# Patient Record
Sex: Male | Born: 1960 | Race: Black or African American | Hispanic: No | Marital: Single | Smoking: Current every day smoker
Health system: Southern US, Community
[De-identification: ages and names within clinical notes are randomized; demographics above are authoritative.]

## PROBLEM LIST (undated history)

## (undated) DIAGNOSIS — F1010 Alcohol abuse, uncomplicated: Secondary | ICD-10-CM

## (undated) HISTORY — PX: FACIAL LACERATION REPAIR: SHX6589

---

## 1998-05-07 ENCOUNTER — Emergency Department (HOSPITAL_COMMUNITY): Admission: EM | Admit: 1998-05-07 | Discharge: 1998-05-07 | Payer: Self-pay | Admitting: Emergency Medicine

## 1998-05-19 ENCOUNTER — Emergency Department (HOSPITAL_COMMUNITY): Admission: EM | Admit: 1998-05-19 | Discharge: 1998-05-19 | Payer: Self-pay | Admitting: Emergency Medicine

## 1998-05-27 ENCOUNTER — Emergency Department (HOSPITAL_COMMUNITY): Admission: EM | Admit: 1998-05-27 | Discharge: 1998-05-27 | Payer: Self-pay | Admitting: Emergency Medicine

## 1999-05-07 ENCOUNTER — Encounter: Payer: Self-pay | Admitting: *Deleted

## 1999-05-07 ENCOUNTER — Emergency Department (HOSPITAL_COMMUNITY): Admission: EM | Admit: 1999-05-07 | Discharge: 1999-05-07 | Payer: Self-pay | Admitting: *Deleted

## 1999-05-12 ENCOUNTER — Emergency Department (HOSPITAL_COMMUNITY): Admission: EM | Admit: 1999-05-12 | Discharge: 1999-05-12 | Payer: Self-pay | Admitting: Emergency Medicine

## 1999-05-21 ENCOUNTER — Emergency Department (HOSPITAL_COMMUNITY): Admission: EM | Admit: 1999-05-21 | Discharge: 1999-05-21 | Payer: Self-pay | Admitting: Emergency Medicine

## 1999-05-22 ENCOUNTER — Encounter: Payer: Self-pay | Admitting: Emergency Medicine

## 1999-07-16 ENCOUNTER — Emergency Department (HOSPITAL_COMMUNITY): Admission: EM | Admit: 1999-07-16 | Discharge: 1999-07-16 | Payer: Self-pay | Admitting: Emergency Medicine

## 1999-12-19 ENCOUNTER — Emergency Department (HOSPITAL_COMMUNITY): Admission: EM | Admit: 1999-12-19 | Discharge: 1999-12-19 | Payer: Self-pay | Admitting: Emergency Medicine

## 1999-12-19 ENCOUNTER — Encounter: Payer: Self-pay | Admitting: Emergency Medicine

## 2000-01-02 ENCOUNTER — Emergency Department (HOSPITAL_COMMUNITY): Admission: EM | Admit: 2000-01-02 | Discharge: 2000-01-02 | Payer: Self-pay | Admitting: Emergency Medicine

## 2000-01-02 ENCOUNTER — Encounter: Payer: Self-pay | Admitting: Emergency Medicine

## 2000-01-21 ENCOUNTER — Inpatient Hospital Stay (HOSPITAL_COMMUNITY): Admission: EM | Admit: 2000-01-21 | Discharge: 2000-01-21 | Payer: Self-pay

## 2000-01-30 ENCOUNTER — Emergency Department (HOSPITAL_COMMUNITY): Admission: EM | Admit: 2000-01-30 | Discharge: 2000-01-30 | Payer: Self-pay | Admitting: Emergency Medicine

## 2000-01-31 ENCOUNTER — Inpatient Hospital Stay (HOSPITAL_COMMUNITY): Admission: AD | Admit: 2000-01-31 | Discharge: 2000-02-03 | Payer: Self-pay | Admitting: Otolaryngology

## 2000-03-24 ENCOUNTER — Emergency Department (HOSPITAL_COMMUNITY): Admission: EM | Admit: 2000-03-24 | Discharge: 2000-03-24 | Payer: Self-pay | Admitting: Emergency Medicine

## 2000-03-24 ENCOUNTER — Encounter: Payer: Self-pay | Admitting: Emergency Medicine

## 2000-06-30 ENCOUNTER — Encounter: Payer: Self-pay | Admitting: Emergency Medicine

## 2000-06-30 ENCOUNTER — Emergency Department (HOSPITAL_COMMUNITY): Admission: EM | Admit: 2000-06-30 | Discharge: 2000-06-30 | Payer: Self-pay | Admitting: Emergency Medicine

## 2000-07-09 ENCOUNTER — Emergency Department (HOSPITAL_COMMUNITY): Admission: EM | Admit: 2000-07-09 | Discharge: 2000-07-09 | Payer: Self-pay | Admitting: Emergency Medicine

## 2000-08-03 ENCOUNTER — Encounter: Payer: Self-pay | Admitting: *Deleted

## 2000-08-03 ENCOUNTER — Emergency Department (HOSPITAL_COMMUNITY): Admission: EM | Admit: 2000-08-03 | Discharge: 2000-08-03 | Payer: Self-pay | Admitting: Emergency Medicine

## 2000-09-17 ENCOUNTER — Emergency Department (HOSPITAL_COMMUNITY): Admission: EM | Admit: 2000-09-17 | Discharge: 2000-09-17 | Payer: Self-pay | Admitting: Emergency Medicine

## 2000-10-31 ENCOUNTER — Emergency Department (HOSPITAL_COMMUNITY): Admission: EM | Admit: 2000-10-31 | Discharge: 2000-10-31 | Payer: Self-pay | Admitting: Emergency Medicine

## 2001-05-17 ENCOUNTER — Emergency Department (HOSPITAL_COMMUNITY): Admission: EM | Admit: 2001-05-17 | Discharge: 2001-05-17 | Payer: Self-pay | Admitting: Emergency Medicine

## 2001-05-17 ENCOUNTER — Encounter: Payer: Self-pay | Admitting: Emergency Medicine

## 2001-07-27 ENCOUNTER — Emergency Department (HOSPITAL_COMMUNITY): Admission: EM | Admit: 2001-07-27 | Discharge: 2001-07-27 | Payer: Self-pay | Admitting: Emergency Medicine

## 2001-08-06 ENCOUNTER — Emergency Department (HOSPITAL_COMMUNITY): Admission: EM | Admit: 2001-08-06 | Discharge: 2001-08-06 | Payer: Self-pay

## 2001-11-03 ENCOUNTER — Emergency Department (HOSPITAL_COMMUNITY): Admission: EM | Admit: 2001-11-03 | Discharge: 2001-11-03 | Payer: Self-pay | Admitting: Emergency Medicine

## 2001-11-03 ENCOUNTER — Encounter: Payer: Self-pay | Admitting: Emergency Medicine

## 2002-01-24 ENCOUNTER — Emergency Department (HOSPITAL_COMMUNITY): Admission: EM | Admit: 2002-01-24 | Discharge: 2002-01-24 | Payer: Self-pay | Admitting: Emergency Medicine

## 2002-02-07 ENCOUNTER — Emergency Department (HOSPITAL_COMMUNITY): Admission: EM | Admit: 2002-02-07 | Discharge: 2002-02-07 | Payer: Self-pay | Admitting: Emergency Medicine

## 2002-03-28 ENCOUNTER — Encounter: Payer: Self-pay | Admitting: Emergency Medicine

## 2002-03-28 ENCOUNTER — Emergency Department (HOSPITAL_COMMUNITY): Admission: EM | Admit: 2002-03-28 | Discharge: 2002-03-29 | Payer: Self-pay | Admitting: Emergency Medicine

## 2003-06-27 ENCOUNTER — Encounter: Payer: Self-pay | Admitting: Family Medicine

## 2003-06-27 ENCOUNTER — Ambulatory Visit (HOSPITAL_COMMUNITY): Admission: RE | Admit: 2003-06-27 | Discharge: 2003-06-27 | Payer: Self-pay | Admitting: Family Medicine

## 2003-07-29 ENCOUNTER — Encounter: Payer: Self-pay | Admitting: Emergency Medicine

## 2003-07-29 ENCOUNTER — Emergency Department (HOSPITAL_COMMUNITY): Admission: EM | Admit: 2003-07-29 | Discharge: 2003-07-29 | Payer: Self-pay | Admitting: Emergency Medicine

## 2004-09-13 ENCOUNTER — Ambulatory Visit: Payer: Self-pay | Admitting: Nurse Practitioner

## 2004-11-04 ENCOUNTER — Emergency Department (HOSPITAL_COMMUNITY): Admission: EM | Admit: 2004-11-04 | Discharge: 2004-11-04 | Payer: Self-pay | Admitting: Emergency Medicine

## 2005-06-14 ENCOUNTER — Emergency Department (HOSPITAL_COMMUNITY): Admission: EM | Admit: 2005-06-14 | Discharge: 2005-06-14 | Payer: Self-pay | Admitting: Emergency Medicine

## 2005-06-30 ENCOUNTER — Emergency Department (HOSPITAL_COMMUNITY): Admission: EM | Admit: 2005-06-30 | Discharge: 2005-06-30 | Payer: Self-pay | Admitting: Family Medicine

## 2005-08-18 ENCOUNTER — Encounter: Admission: RE | Admit: 2005-08-18 | Discharge: 2005-09-07 | Payer: Self-pay | Admitting: Family Medicine

## 2006-03-29 ENCOUNTER — Emergency Department (HOSPITAL_COMMUNITY): Admission: EM | Admit: 2006-03-29 | Discharge: 2006-03-29 | Payer: Self-pay | Admitting: Emergency Medicine

## 2008-03-15 ENCOUNTER — Emergency Department (HOSPITAL_COMMUNITY): Admission: EM | Admit: 2008-03-15 | Discharge: 2008-03-16 | Payer: Self-pay | Admitting: Family Medicine

## 2010-07-05 ENCOUNTER — Emergency Department (HOSPITAL_COMMUNITY): Admission: EM | Admit: 2010-07-05 | Discharge: 2010-07-05 | Payer: Self-pay | Admitting: Emergency Medicine

## 2010-11-25 ENCOUNTER — Ambulatory Visit
Admission: RE | Admit: 2010-11-25 | Discharge: 2010-11-25 | Payer: Self-pay | Source: Home / Self Care | Attending: Otolaryngology | Admitting: Otolaryngology

## 2010-12-03 ENCOUNTER — Emergency Department (HOSPITAL_COMMUNITY)
Admission: EM | Admit: 2010-12-03 | Discharge: 2010-12-03 | Payer: Self-pay | Source: Home / Self Care | Admitting: Emergency Medicine
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
## 2011-09-11 LAB — RAPID URINE DRUG SCREEN, HOSP PERFORMED
Amphetamines: NOT DETECTED
Barbiturates: NOT DETECTED
Benzodiazepines: NOT DETECTED
Tetrahydrocannabinol: NOT DETECTED

## 2011-09-11 LAB — POCT I-STAT, CHEM 8
Potassium: 4
Sodium: 133 — ABNORMAL LOW

## 2011-09-11 LAB — POCT CARDIAC MARKERS
CKMB, poc: <1 — ABNORMAL LOW
Myoglobin, poc: 19.5

## 2012-05-10 ENCOUNTER — Encounter (HOSPITAL_COMMUNITY): Payer: Self-pay | Admitting: Physical Medicine and Rehabilitation

## 2012-05-10 ENCOUNTER — Inpatient Hospital Stay (HOSPITAL_COMMUNITY): Admission: RE | Admit: 2012-05-10 | Payer: Medicare Other | Source: Ambulatory Visit | Admitting: Psychiatry

## 2012-05-10 ENCOUNTER — Emergency Department (HOSPITAL_COMMUNITY)
Admission: EM | Admit: 2012-05-10 | Discharge: 2012-05-10 | Disposition: A | Discharge status: Home / Self Care | Payer: PRIVATE HEALTH INSURANCE | Attending: Emergency Medicine | Admitting: Emergency Medicine

## 2012-05-10 DIAGNOSIS — F17200 Nicotine dependence, unspecified, uncomplicated: Secondary | ICD-10-CM | POA: Insufficient documentation

## 2012-05-10 DIAGNOSIS — F1010 Alcohol abuse, uncomplicated: Secondary | ICD-10-CM | POA: Insufficient documentation

## 2012-05-10 LAB — CBC
Hemoglobin: 14.5 g/dL (ref 13.0–17.0)
MCHC: 36.3 g/dL — ABNORMAL HIGH (ref 30.0–36.0)
Platelets: 238 10*3/uL (ref 150–400)
RBC: 4.42 MIL/uL (ref 4.22–5.81)
RDW: 13.8 % (ref 11.5–15.5)

## 2012-05-10 LAB — COMPREHENSIVE METABOLIC PANEL
ALT: 32 U/L (ref 0–53)
Albumin: 3.7 g/dL (ref 3.5–5.2)
Alkaline Phosphatase: 106 U/L (ref 39–117)
BUN: 10 mg/dL (ref 6–23)
Chloride: 102 mEq/L (ref 96–112)
GFR calc Af Amer: >90 mL/min (ref >90)
GFR calc non Af Amer: >90 mL/min (ref >90)
Potassium: 3.5 mEq/L (ref 3.5–5.1)
Sodium: 139 mEq/L (ref 135–145)
Total Bilirubin: 0.2 mg/dL — ABNORMAL LOW (ref 0.3–1.2)
Total Protein: 7.6 g/dL (ref 6.0–8.3)

## 2012-05-10 LAB — RAPID URINE DRUG SCREEN, HOSP PERFORMED
Amphetamines: NOT DETECTED
Barbiturates: NOT DETECTED
Opiates: NOT DETECTED

## 2012-05-10 MED ORDER — ZOLPIDEM TARTRATE 5 MG PO TABS: 5.0000 mg | ORAL_TABLET | Freq: Every evening | ORAL | Status: DC | PRN

## 2012-05-10 MED ORDER — NICOTINE 21 MG/24HR TD PT24
21.0000 mg | MEDICATED_PATCH | Freq: Every day | TRANSDERMAL | Status: DC
  Administered 2012-05-10: 21 mg via TRANSDERMAL
  Filled 2012-05-10: qty 1

## 2012-05-10 MED ORDER — ACETAMINOPHEN 325 MG PO TABS
650.0000 mg | ORAL_TABLET | ORAL | Status: DC | PRN
  Administered 2012-05-10: 650 mg via ORAL
  Filled 2012-05-10: qty 2

## 2012-05-10 MED ORDER — ONDANSETRON HCL 8 MG PO TABS: 4.0000 mg | ORAL_TABLET | Freq: Three times a day (TID) | ORAL | Status: DC | PRN

## 2012-05-10 MED ORDER — IBUPROFEN 200 MG PO TABS: 600.0000 mg | ORAL_TABLET | Freq: Three times a day (TID) | ORAL | Status: DC | PRN
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
## 2012-05-10 NOTE — ED Notes (Signed)
Patient refuses treatment to [facility]. ACT to inform EDP for d/c home

## 2012-05-10 NOTE — ED Notes (Signed)
Pt presents to department for evaluation of detox from ETOH. States "I want to get help." drinks x5 beers each day, last drink this afternoon. Denies pain at the time. He is conscious alert and oriented x4. Denies SI/HI. No signs of distress at present.

## 2012-05-10 NOTE — ED Notes (Signed)
MD, Linker at bedside. 

## 2012-05-10 NOTE — Discharge Instructions (Signed)
Return to the ED with any concerns including seizures, vomiting and not able to keep down liquids, decreaesed level of alertness/lethargy, or any other alarming symptoms

## 2012-05-10 NOTE — ED Notes (Signed)
Patient has been accepted to [facility]. ACT at bedside speaking with patient re: plan of care

## 2012-05-10 NOTE — ED Provider Notes (Signed)
History     CSN: [id number]  Arrival date & time 05/10/12  1458   First MD Initiated Contact with Patient 05/10/12 1527      Chief Complaint  Patient presents with  . Alcohol Problem    (Consider location/radiation/quality/duration/timing/severity/associated sxs/prior treatment) HPI Pt presents with c/o requesting alcohol detox/rehab.  Pt states that he drinks daily- has been trying to cut down on his own.  He also states he is in a program after getting a DUI and he was recommended to come to try to get help for his alcohol use priorto gloing to court.  Pt denies other substance use.  Has not had seizures or vomiting or any other systemic symtpoms.  No recent illnesses.  He denies feeling homicidal or sucidal.  There are no other alleviating or modifying factors.  There are no other associated systemic symptoms.   No past medical history on file.  No past surgical history on file.  History reviewed. No pertinent family history.  History  Substance Use Topics  . Smoking status: Current Everyday Smoker  . Smokeless tobacco: Not on file  . Alcohol Use: 43.2 oz/week    72 Cans of beer per week      Review of Systems ROS reviewed and all otherwise negative except for mentioned in HPI  Allergies  Review of patient's allergies indicates no known allergies.  Home Medications  No current outpatient prescriptions on file.  BP 169/107  Pulse 85  Temp(Src) 98.6 F (37 C) (Oral)  Resp 19  SpO2 98% Vitals reviewed Physical Exam Physical Examination: General appearance - alert, well appearing, and in no distress Mental status - alert, oriented to person, place, and time Eyes - pupils equal and reactive, extraocular eye movements intact Mouth - mucous membranes moist, pharynx normal without lesions Chest - clear to auscultation, no wheezes, rales or rhonchi, symmetric air entry Heart - normal rate, regular rhythm, normal S1, S2, no murmurs, rubs, clicks or gallops Abdomen -  soft, nontender, nondistended, no masses or organomegaly Neurological - alert, oriented, normal speech, no focal findings or movement disorder noted Extremities - peripheral pulses normal, no pedal edema, no clubbing or cyanosis Skin - normal coloration and turgor, no rashes Pscyh- normal mood and affect, calm and cooperative  ED Course  Procedures (including critical care time)  4:50 PM  Discussed with ACT team, they will see and evaluate patient in the ED.   10:23 PM ACT has seen patient in the ED. He was referred and accepted to Behavioral health and now does not want to go.  Pt is here voluntarily, and no active signs of withdrawal will discharge.   Labs Reviewed  COMPREHENSIVE METABOLIC PANEL - Abnormal; Notable for the following:    AST 46 (*)    Total Bilirubin 0.2 (*)    All other components within normal limits  ETHANOL - Abnormal; Notable for the following:    Alcohol, Ethyl (B) 170 (*)    All other components within normal limits  CBC - Abnormal; Notable for the following:    MCHC 36.3 (*)    All other components within normal limits  URINE RAPID DRUG SCREEN (HOSP PERFORMED) - Abnormal; Notable for the following:    Cocaine POSITIVE (*)    All other components within normal limits  LAB REPORT - SCANNED   No results found.   1. Alcohol abuse       MDM  Pt presenting requesting alcohol detox.  He was medically cleared, exhibiting  no signs of withdrawal.  He was evaluted by ACT team who found placement for him at [facility]- however, he then declined to proceed with treatment. Therefore he was discharged- given strict return precautions.  He is agreeable with this plan.         Ethelda Chick, MD 05/11/12 [phone]

## 2012-05-10 NOTE — ED Notes (Signed)
ACT team at bedside.  

## 2012-05-16 ENCOUNTER — Inpatient Hospital Stay (HOSPITAL_COMMUNITY): Admit: 2012-05-16 | Payer: Self-pay

## 2012-05-27 ENCOUNTER — Emergency Department (HOSPITAL_COMMUNITY)
Admission: EM | Admit: 2012-05-27 | Discharge: 2012-05-27 | Disposition: A | Discharge status: Home / Self Care | Payer: Medicare Other | Attending: Emergency Medicine | Admitting: Emergency Medicine

## 2012-05-27 ENCOUNTER — Encounter (HOSPITAL_COMMUNITY): Payer: Self-pay

## 2012-05-27 DIAGNOSIS — F1010 Alcohol abuse, uncomplicated: Secondary | ICD-10-CM | POA: Insufficient documentation

## 2012-05-27 DIAGNOSIS — F17200 Nicotine dependence, unspecified, uncomplicated: Secondary | ICD-10-CM | POA: Insufficient documentation

## 2012-05-27 LAB — BASIC METABOLIC PANEL WITH GFR
BUN: 7 mg/dL (ref 6–23)
CO2: 22 meq/L (ref 19–32)
Calcium: 8.9 mg/dL (ref 8.4–10.5)
Chloride: 99 meq/L (ref 96–112)
Creatinine, Ser: 0.74 mg/dL (ref 0.50–1.35)
GFR calc Af Amer: >90 mL/min
GFR calc non Af Amer: >90 mL/min
Glucose, Bld: 94 mg/dL (ref 70–99)
Potassium: 3.6 meq/L (ref 3.5–5.1)
Sodium: 137 meq/L (ref 135–145)

## 2012-05-27 LAB — CBC
Hemoglobin: 13.7 g/dL (ref 13.0–17.0)
MCH: 32.1 pg (ref 26.0–34.0)
MCHC: 35.9 g/dL (ref 30.0–36.0)
Platelets: 239 10*3/uL (ref 150–400)
RBC: 4.27 MIL/uL (ref 4.22–5.81)

## 2012-05-27 LAB — RAPID URINE DRUG SCREEN, HOSP PERFORMED
Benzodiazepines: NOT DETECTED
Cocaine: NOT DETECTED
Opiates: NOT DETECTED

## 2012-05-27 LAB — ETHANOL: Alcohol, Ethyl (B): 280 mg/dL — ABNORMAL HIGH (ref 0–11)

## 2012-05-27 MED ORDER — NICOTINE 21 MG/24HR TD PT24
21.0000 mg | MEDICATED_PATCH | Freq: Every day | TRANSDERMAL | Status: DC
  Administered 2012-05-27: 21 mg via TRANSDERMAL
  Filled 2012-05-27: qty 1

## 2012-05-27 MED ORDER — ALUM & MAG HYDROXIDE-SIMETH 200-200-20 MG/5ML PO SUSP: 30.0000 mL | ORAL | Status: DC | PRN

## 2012-05-27 MED ORDER — ZOLPIDEM TARTRATE 5 MG PO TABS: 5.0000 mg | ORAL_TABLET | Freq: Every evening | ORAL | Status: DC | PRN

## 2012-05-27 MED ORDER — LORAZEPAM 1 MG PO TABS
0.0000 mg | ORAL_TABLET | Freq: Four times a day (QID) | ORAL | Status: DC
  Administered 2012-05-27: 1 mg via ORAL
  Filled 2012-05-27: qty 1

## 2012-05-27 MED ORDER — THIAMINE HCL 100 MG/ML IJ SOLN: 100.0000 mg | Freq: Every day | INTRAMUSCULAR | Status: DC

## 2012-05-27 MED ORDER — VITAMIN B-1 100 MG PO TABS
100.0000 mg | ORAL_TABLET | Freq: Every day | ORAL | Status: DC
  Administered 2012-05-27: 100 mg via ORAL
  Filled 2012-05-27: qty 1

## 2012-05-27 MED ORDER — ONDANSETRON HCL 8 MG PO TABS: 4.0000 mg | ORAL_TABLET | Freq: Three times a day (TID) | ORAL | Status: DC | PRN

## 2012-05-27 MED ORDER — ACETAMINOPHEN 325 MG PO TABS: 650.0000 mg | ORAL_TABLET | ORAL | Status: DC | PRN

## 2012-05-27 MED ORDER — ADULT MULTIVITAMIN W/MINERALS CH
1.0000 | ORAL_TABLET | Freq: Every day | ORAL | Status: DC
  Administered 2012-05-27: 1 via ORAL
  Filled 2012-05-27 (×2): qty 1

## 2012-05-27 MED ORDER — LORAZEPAM 1 MG PO TABS: 0.0000 mg | ORAL_TABLET | Freq: Two times a day (BID) | ORAL | Status: DC

## 2012-05-27 MED ORDER — IBUPROFEN 200 MG PO TABS: 600.0000 mg | ORAL_TABLET | Freq: Three times a day (TID) | ORAL | Status: DC | PRN

## 2012-05-27 MED ORDER — LORAZEPAM 1 MG PO TABS: 1.0000 mg | ORAL_TABLET | Freq: Four times a day (QID) | ORAL | Status: DC | PRN

## 2012-05-27 MED ORDER — FOLIC ACID 1 MG PO TABS
1.0000 mg | ORAL_TABLET | Freq: Every day | ORAL | Status: DC
  Administered 2012-05-27: 1 mg via ORAL
  Filled 2012-05-27: qty 1

## 2012-05-27 MED ORDER — LORAZEPAM 2 MG/ML IJ SOLN: 1.0000 mg | Freq: Four times a day (QID) | INTRAMUSCULAR | Status: DC | PRN

## 2012-05-27 NOTE — ED Provider Notes (Signed)
History     CSN: [id number]  Arrival date & time 05/27/12  1826   First MD Initiated Contact with Patient 05/27/12 1850      Chief Complaint  Patient presents with  . Alcohol Problem    Detox    (Consider location/radiation/quality/duration/timing/severity/associated sxs/prior treatment) Patient is a 51 y.o. male presenting with alcohol problem. The history is provided by the patient.  Alcohol Problem This is a chronic problem.  Requests assistance with detox. Typically drinks at least 2 40oz beers each day, last intake this afternoon. Has never had withdrawal seizures. Not feeling anxious or tremulous at this time. Denies any drug use. Denies SI/HI or hallucinations. Per records, seen in ED for same several weeks ago but left prior to placement. Denies physical complaints aside from chronic, unchanged right hip pain.  History reviewed. No pertinent past medical history.  History reviewed. No pertinent past surgical history.  No family history on file.  History  Substance Use Topics  . Smoking status: Current Everyday Smoker  . Smokeless tobacco: Not on file  . Alcohol Use: 43.2 oz/week    72 Cans of beer per week      Review of Systems 10 systems reviewed and are negative for acute change except as noted in the HPI.  Allergies  Review of patient's allergies indicates no known allergies.  Home Medications   Current Outpatient Rx  Name Route Sig Dispense Refill  . ACETAMINOPHEN 325 MG PO TABS Oral Take 650 mg by mouth every 6 (six) hours as needed. For hip pain      BP 143/97  Pulse 90  Temp(Src) 98.5 F (36.9 C) (Oral)  Resp 18  SpO2 95%  Physical Exam  Nursing note reviewed. Constitutional: He appears well-developed and well-nourished. No distress.       BP 143/97  Pulse 90  Temp(Src) 98.5 F (36.9 C) (Oral)  Resp 18  SpO2 95% VS reviewed, sig for HTN. No tachycardia.  HENT:  Head: Normocephalic and atraumatic.  Right Ear: External ear normal.    Left Ear: External ear normal.       MMM  Eyes: Pupils are equal, round, and reactive to light.  Neck: Neck supple.  Cardiovascular: Normal rate, regular rhythm and normal heart sounds.        Bilateral radial and DP pulses are 2+   Pulmonary/Chest: Effort normal and breath sounds normal. No respiratory distress. He has no wheezes.  Abdominal: Soft. Bowel sounds are normal. He exhibits no distension. There is no tenderness. There is no guarding.  Neurological: He is alert. He has normal strength. He displays no tremor. No cranial nerve deficit (3-12 intact). He displays no seizure activity. Gait (antalgic) abnormal. GCS eye subscore is 4. GCS verbal subscore is 5. GCS motor subscore is 6.  Skin: Skin is warm and dry.  Psychiatric: He has a normal mood and affect. His speech is normal and behavior is normal. Thought content is not paranoid. He expresses no homicidal and no suicidal ideation.    ED Course  Procedures (including critical care time)   Labs Reviewed  ETHANOL  CBC  BASIC METABOLIC PANEL  URINE RAPID DRUG SCREEN (HOSP PERFORMED)   No results found.   Dx 1: Alcohol abuse   MDM  Pt requesting assistance with detox. All labs reviewed. Alcohol level suggests intoxication. After several hours in ED, pt is clinically sober with clear speech, antalgic but otherwise normal gait, no tremors, no tachycardia. He has a court date in  less than 48 hours and cannot be placed at any facility for detox until after this. He voices understanding and is given resources at d/c for follow-up after court.        Shaaron Adler, [place] 05/27/12 2206

## 2012-05-27 NOTE — ED Notes (Signed)
Pt reports having last drink (2 beers) this morning around 0900.

## 2012-05-27 NOTE — Discharge Instructions (Signed)
Alcohol Withdrawal   Anytime drug use is interfering with normal living activities it has become abuse. This includes problems with family and friends. Psychological dependence has developed when your mind tells you that the drug is needed. This is usually followed by physical dependence when a continuing increase of drugs are required to get the same feeling or "high." This is known as addiction or chemical dependency. A person's risk is much higher if there is a history of chemical dependency in the family.   Mild Withdrawal Following Stopping Alcohol, When Addiction or Chemical Dependency Has Developed   When a person has developed tolerance to alcohol, any sudden stopping of alcohol can cause uncomfortable physical symptoms. Most of the time these are mild and consist of tremors in the hands and increases in heart rate, breathing, and temperature. Sometimes these symptoms are associated with anxiety, panic attacks, and bad dreams. There may also be stomach upset. Normal sleep patterns are often interrupted with periods of inability to sleep (insomnia). This may last for 6 months. Because of this discomfort, many people choose to continue drinking to get rid of this discomfort and to try to feel normal.   Severe Withdrawal with Decreased or No Alcohol Intake, When Addiction or Chemical Dependency Has Developed   About five percent of alcoholics will develop signs of severe withdrawal when they stop using alcohol. One sign of this is development of generalized seizures (convulsions). Other signs of this are severe agitation and confusion. This may be associated with believing in things which are not real or seeing things which are not really there (delusions and hallucinations). Vitamin deficiencies are usually present if alcohol intake has been long-term. Treatment for this most often requires hospitalization and close observation.   Addiction can only be helped by stopping use of all chemicals. This is hard but  may save your life. With continual alcohol use, possible outcomes are usually loss of self respect and esteem, violence, and death.   Addiction cannot be cured but it can be stopped. This often requires outside help and the care of professionals. Treatment centers are listed in the yellow pages under Cocaine, Narcotics, and Alcoholics Anonymous. Most hospitals and clinics can refer you to a specialized care center.   It is not necessary for you to go through the uncomfortable symptoms of withdrawal. Your caregiver can provide you with medicines that will help you through this difficult period. Try to avoid situations, friends, or drugs that made it possible for you to keep using alcohol in the past. Learn how to say no.   It takes a long period of time to overcome addictions to all drugs, including alcohol. There may be many times when you feel as though you want a drink. After getting rid of the physical addiction and withdrawal, you will have a lessening of the craving which tells you that you need alcohol to feel normal. Call your caregiver if more support is needed. Learn who to talk to in your family and among your friends so that during these periods you can receive outside help. Alcoholics Anonymous (AA) has helped many people over the years. To get further help, contact AA or call your caregiver, counselor, or clergyperson. Al-Anon and Alateen are support groups for friends and family members of an alcoholic. The people who love and care for an alcoholic often need help, too. For information about these organizations, check your phone directory or call a local alcoholism treatment center.   SEEK IMMEDIATE MEDICAL CARE   IF:   You have a seizure.   You have a fever.   You experience uncontrolled vomiting or you vomit up blood. This may be bright red or look like black coffee grounds.   You have blood in the stool. This may be bright red or appear as a black, tarry, bad-smelling stool.   You become lightheaded  or faint. Do not drive if you feel this way. Have someone else drive you or call 911 for help.   You become more agitated or confused.   You develop uncontrolled anxiety.   You begin to see things that are not really there (hallucinate).  Your caregiver has determined that you completely understand your medical condition, and that your mental state is back to normal. You understand that you have been treated for alcohol withdrawal, have agreed not to drink any alcohol for a minimum of 1 day, will not operate a car or other machinery for 24 hours, and have had an opportunity to ask any questions about your condition.   Document Released: 09/13/2005 Document Revised: 11/23/2011 Document Reviewed: 07/22/2008   [employer] Patient Information 2012 [employer].

## 2012-05-27 NOTE — ED Notes (Signed)
Patient presents requesting detox from alcohol, last drink was this afternoon and patient reports he had 1 beer.  Patient has been in rehabilitation nearly 1 year ago.  No tremors or anxiety noted.

## 2012-05-29 NOTE — ED Provider Notes (Signed)
Medical screening examination/treatment/procedure(s) were performed by non-physician practitioner and as supervising physician I was immediately available for consultation/collaboration.   Joya Gaskins, MD 05/29/12 [phone]

## 2017-03-30 ENCOUNTER — Encounter (HOSPITAL_COMMUNITY): Payer: Self-pay | Admitting: Obstetrics and Gynecology

## 2017-03-30 ENCOUNTER — Emergency Department (HOSPITAL_COMMUNITY): Payer: Medicare Other

## 2017-03-30 ENCOUNTER — Emergency Department (HOSPITAL_COMMUNITY)
Admission: EM | Admit: 2017-03-30 | Discharge: 2017-03-31 | Disposition: A | Discharge status: Home / Self Care | Payer: Medicare Other | Attending: Emergency Medicine | Admitting: Emergency Medicine

## 2017-03-30 DIAGNOSIS — R066 Hiccough: Secondary | ICD-10-CM | POA: Diagnosis not present

## 2017-03-30 DIAGNOSIS — F17200 Nicotine dependence, unspecified, uncomplicated: Secondary | ICD-10-CM | POA: Diagnosis not present

## 2017-03-30 DIAGNOSIS — F1010 Alcohol abuse, uncomplicated: Secondary | ICD-10-CM | POA: Diagnosis not present

## 2017-03-30 DIAGNOSIS — R10816 Epigastric abdominal tenderness: Secondary | ICD-10-CM | POA: Diagnosis not present

## 2017-03-30 NOTE — ED Provider Notes (Signed)
[facility]-EMERGENCY DEPT Provider Note   CSN: [id number] Arrival date & time: 03/30/17  2021    By signing my name below, I, Clayton Alvarez, attest that this documentation has been prepared under the direction and in the presence of Benjiman Core, MD. Electronically Signed: Valentino Alvarez, ED Scribe. 03/30/17. 12:59 AM.  History   Chief Complaint Chief Complaint  Patient presents with  . Hiccups   The history is provided by the patient. No language interpreter was used.   HPI Comments: Clayton Alvarez is a 56 y.o. male with no pertinent PMHx brought in by ambulance who presents to the Emergency Department complaining of 6/10, moderate, intemittent, abdominal pain secondary to episodic hiccups onset a month ago. Pt describes his pain as aching, non-radiating sensation. He reports associated chills. No alleviating factors noted. Pt denies taking any OTC medications for relief. Pt also complains of foot pain. Pt notes his abdominal pain is worsened with direct pressure, but states it has subsided in the ED. Pt denies CP, SOB, nausea, vomiting, weight loss. Pt states his is a current smoker. He admits to illicit drug use.    History reviewed. No pertinent past medical history.  There are no active problems to display for this patient.   History reviewed. No pertinent surgical history.     Home Medications    Prior to Admission medications   Medication Sig Start Date End Date Taking? Authorizing Provider  acetaminophen (TYLENOL) 325 MG tablet Take 650 mg by mouth every 6 (six) hours as needed. For hip pain    Historical Provider, MD    Family History No family history on file.  Social History Social History  Substance Use Topics  . Smoking status: Current Every Day Smoker  . Smokeless tobacco: Not on file  . Alcohol use 43.2 oz/week    72 Cans of beer per week     Allergies   Patient has no known allergies.   Review of Systems Review of Systems  Constitutional:  Positive for chills. Negative for unexpected weight change.  Respiratory: Negative for shortness of breath.   Cardiovascular: Negative for chest pain.  Gastrointestinal: Positive for abdominal pain. Negative for nausea and vomiting.  All other systems reviewed and are negative.    Physical Exam Updated Vital Signs BP (!) 146/95 (BP Location: Left Arm)   Pulse 96   Temp 98.4 F (36.9 C) (Oral)   Resp 14   Ht 6' (1.829 m)   Wt 160 lb (72.6 kg)   SpO2 97%   BMI 21.70 kg/m   Physical Exam  Constitutional: He appears well-developed and well-nourished.  HENT:  Head: Normocephalic and atraumatic.  Hiccups slow down with talking.   Eyes: Conjunctivae are normal. Right eye exhibits no discharge. Left eye exhibits no discharge.  Pulmonary/Chest: Effort normal. No respiratory distress.  Abdominal: There is tenderness.  Epigastric to RUQ tenderness.   Neurological: He is alert. Coordination normal.  Good dorsalis pedis pulses bilaterally.   Skin: Skin is warm and dry. No rash noted. He is not diaphoretic. No erythema.  Psychiatric: He has a normal mood and affect.  Nursing note and vitals reviewed.    ED Treatments / Results   DIAGNOSTIC STUDIES: Oxygen Saturation is 100% on RA, normal by my interpretation.    COORDINATION OF CARE: 11:44 PM Discussed treatment plan with pt at bedside which includes labs and chest imaging and pt agreed to plan.   Labs (all labs ordered are listed, but only abnormal results are  displayed) Labs Reviewed  COMPREHENSIVE METABOLIC PANEL - Abnormal; Notable for the following:       Result Value   Sodium 132 (*)    BUN <5 (*)    Calcium 8.4 (*)    AST 77 (*)    Alkaline Phosphatase 143 (*)    All other components within normal limits  ETHANOL - Abnormal; Notable for the following:    Alcohol, Ethyl (B) 218 (*)    All other components within normal limits  CBC WITH DIFFERENTIAL/PLATELET - Abnormal; Notable for the following:    WBC 3.7 (*)      MCHC 36.5 (*)    Neutro Abs 1.3 (*)    All other components within normal limits  LIPASE, BLOOD    EKG  EKG Interpretation None       Radiology Dg Chest 2 View  Result Date: 03/31/2017 CLINICAL DATA:  Hiccups and shortness of breath EXAM: CHEST  2 VIEW COMPARISON:  Chest radiograph 03/15/2008 FINDINGS: Lungs are hyperexpanded. Cardiomediastinal silhouette is normal. No focal airspace consolidation or pulmonary edema. No pleural effusion or pneumothorax. IMPRESSION: No active cardiopulmonary disease. Electronically Signed   By: Deatra Robinson M.D.   On: 03/31/2017 00:06    Procedures Procedures (including critical care time)  Medications Ordered in ED Medications - No data to display   Initial Impression / Assessment and Plan / ED Course  I have reviewed the triage vital signs and the nursing notes.  Pertinent labs & imaging results that were available during my care of the patient were reviewed by me and considered in my medical decision making (see chart for details).     [facility] from jail with hiccups. Labs reassuring. X-ray reassuring. Will discharge home and will need to follow-up with primary care doctor. He is however an apparent heavy drinker. Will may be watched for withdrawal.  Final Clinical Impressions(s) / ED Diagnoses   Final diagnoses:  Hiccups  Alcohol abuse    New Prescriptions Discharge Medication List as of 03/31/2017  1:44 AM      I personally performed the services described in this documentation, which was scribed in my presence. The recorded information has been reviewed and is accurate.       Benjiman Core, MD 03/31/17 [phone]

## 2017-03-30 NOTE — ED Triage Notes (Addendum)
PER EMS:  Pt arrives to ED with [employer] escort from jail. C/o hiccups and SOB associated w/ the hiccups. Patient reports he has had hiccups for approximately 1 month. Pt denies any medical HX  O2 100% during hiccups 147/100 84 HR  Patient denies SOB, chest pain, N/V/D, and abdominal pain at this time. Ambulatory to triage.

## 2017-03-30 NOTE — ED Notes (Signed)
Pt states his hiccups started two months ago. Denies chest pain or SOB, just states it makes it hard to talk. States nothing makes it better or worse. No N/V associated, just burping.

## 2017-03-31 DIAGNOSIS — R066 Hiccough: Secondary | ICD-10-CM | POA: Diagnosis not present

## 2017-03-31 LAB — LIPASE, BLOOD: Lipase: 19 U/L (ref 11–51)

## 2017-03-31 LAB — CBC WITH DIFFERENTIAL/PLATELET
Basophils Absolute: 0 10*3/uL (ref 0.0–0.1)
Basophils Relative: 1 %
EOS ABS: 0.1 10*3/uL (ref 0.0–0.7)
Eosinophils Relative: 1 %
HEMATOCRIT: 40.5 % (ref 39.0–52.0)
HEMOGLOBIN: 14.8 g/dL (ref 13.0–17.0)
LYMPHS ABS: 1.9 10*3/uL (ref 0.7–4.0)
Lymphocytes Relative: 52 %
MCH: 32.4 pg (ref 26.0–34.0)
MCHC: 36.5 g/dL — ABNORMAL HIGH (ref 30.0–36.0)
MCV: 88.6 fL (ref 78.0–100.0)
Monocytes Absolute: 0.4 10*3/uL (ref 0.1–1.0)
Monocytes Relative: 11 %
NEUTROS ABS: 1.3 10*3/uL — AB (ref 1.7–7.7)
Neutrophils Relative %: 35 %
Platelets: 217 10*3/uL (ref 150–400)
RBC: 4.57 MIL/uL (ref 4.22–5.81)
RDW: 12.8 % (ref 11.5–15.5)
WBC: 3.7 10*3/uL — AB (ref 4.0–10.5)

## 2017-03-31 LAB — COMPREHENSIVE METABOLIC PANEL
ALBUMIN: 3.6 g/dL (ref 3.5–5.0)
ALT: 50 U/L (ref 17–63)
ANION GAP: 9 (ref 5–15)
AST: 77 U/L — AB (ref 15–41)
Alkaline Phosphatase: 143 U/L — ABNORMAL HIGH (ref 38–126)
CO2: 22 mmol/L (ref 22–32)
Calcium: 8.4 mg/dL — ABNORMAL LOW (ref 8.9–10.3)
Chloride: 101 mmol/L (ref 101–111)
Creatinine, Ser: 0.69 mg/dL (ref 0.61–1.24)
GFR calc Af Amer: >60 mL/min (ref >60)
GFR calc non Af Amer: >60 mL/min (ref >60)
GLUCOSE: 86 mg/dL (ref 65–99)
Potassium: 3.9 mmol/L (ref 3.5–5.1)
SODIUM: 132 mmol/L — AB (ref 135–145)
Total Bilirubin: 0.5 mg/dL (ref 0.3–1.2)
Total Protein: 7.3 g/dL (ref 6.5–8.1)

## 2017-03-31 LAB — ETHANOL: Alcohol, Ethyl (B): 218 mg/dL — ABNORMAL HIGH (ref <5)

## 2017-03-31 NOTE — Discharge Instructions (Signed)
Follow up with a primary care doctor. Watch for alcohol withdrawal.

## 2017-09-03 ENCOUNTER — Encounter (HOSPITAL_COMMUNITY): Payer: Self-pay | Admitting: Emergency Medicine

## 2017-09-03 ENCOUNTER — Inpatient Hospital Stay (HOSPITAL_COMMUNITY)
Admission: EM | Admit: 2017-09-03 | Discharge: 2017-09-08 | DRG: 896 | Disposition: A | Discharge status: Home Health | Payer: Medicare Other | Attending: Internal Medicine | Admitting: Internal Medicine

## 2017-09-03 DIAGNOSIS — F10239 Alcohol dependence with withdrawal, unspecified: Principal | ICD-10-CM | POA: Diagnosis present

## 2017-09-03 DIAGNOSIS — E871 Hypo-osmolality and hyponatremia: Secondary | ICD-10-CM | POA: Diagnosis present

## 2017-09-03 DIAGNOSIS — F14129 Cocaine abuse with intoxication, unspecified: Secondary | ICD-10-CM | POA: Diagnosis present

## 2017-09-03 DIAGNOSIS — R509 Fever, unspecified: Secondary | ICD-10-CM

## 2017-09-03 DIAGNOSIS — R627 Adult failure to thrive: Secondary | ICD-10-CM

## 2017-09-03 DIAGNOSIS — R066 Hiccough: Secondary | ICD-10-CM | POA: Diagnosis present

## 2017-09-03 DIAGNOSIS — R109 Unspecified abdominal pain: Secondary | ICD-10-CM | POA: Diagnosis present

## 2017-09-03 DIAGNOSIS — E876 Hypokalemia: Secondary | ICD-10-CM | POA: Diagnosis present

## 2017-09-03 DIAGNOSIS — Z79899 Other long term (current) drug therapy: Secondary | ICD-10-CM

## 2017-09-03 DIAGNOSIS — R918 Other nonspecific abnormal finding of lung field: Secondary | ICD-10-CM | POA: Diagnosis present

## 2017-09-03 DIAGNOSIS — F1010 Alcohol abuse, uncomplicated: Secondary | ICD-10-CM | POA: Diagnosis present

## 2017-09-03 DIAGNOSIS — R112 Nausea with vomiting, unspecified: Secondary | ICD-10-CM

## 2017-09-03 DIAGNOSIS — E43 Unspecified severe protein-calorie malnutrition: Secondary | ICD-10-CM | POA: Diagnosis present

## 2017-09-03 DIAGNOSIS — R000 Tachycardia, unspecified: Secondary | ICD-10-CM | POA: Diagnosis present

## 2017-09-03 DIAGNOSIS — E860 Dehydration: Secondary | ICD-10-CM | POA: Diagnosis present

## 2017-09-03 DIAGNOSIS — M1611 Unilateral primary osteoarthritis, right hip: Secondary | ICD-10-CM | POA: Diagnosis present

## 2017-09-03 DIAGNOSIS — Z720 Tobacco use: Secondary | ICD-10-CM | POA: Diagnosis present

## 2017-09-03 DIAGNOSIS — F17200 Nicotine dependence, unspecified, uncomplicated: Secondary | ICD-10-CM | POA: Diagnosis present

## 2017-09-03 DIAGNOSIS — K2920 Alcoholic gastritis without bleeding: Secondary | ICD-10-CM | POA: Diagnosis present

## 2017-09-03 DIAGNOSIS — Z681 Body mass index (BMI) 19 or less, adult: Secondary | ICD-10-CM

## 2017-09-03 DIAGNOSIS — Z781 Physical restraint status: Secondary | ICD-10-CM

## 2017-09-03 DIAGNOSIS — I10 Essential (primary) hypertension: Secondary | ICD-10-CM | POA: Diagnosis present

## 2017-09-03 HISTORY — DX: Alcohol abuse, uncomplicated: F10.10

## 2017-09-03 LAB — COMPREHENSIVE METABOLIC PANEL
ALK PHOS: 160 U/L — AB (ref 38–126)
ALT: 46 U/L (ref 17–63)
AST: 69 U/L — ABNORMAL HIGH (ref 15–41)
Albumin: 3.6 g/dL (ref 3.5–5.0)
Anion gap: 15 (ref 5–15)
BILIRUBIN TOTAL: 1 mg/dL (ref 0.3–1.2)
BUN: 7 mg/dL (ref 6–20)
CALCIUM: 8.2 mg/dL — AB (ref 8.9–10.3)
CO2: 22 mmol/L (ref 22–32)
CREATININE: 0.78 mg/dL (ref 0.61–1.24)
Chloride: 80 mmol/L — ABNORMAL LOW (ref 101–111)
Glucose, Bld: 101 mg/dL — ABNORMAL HIGH (ref 65–99)
Potassium: 2.9 mmol/L — ABNORMAL LOW (ref 3.5–5.1)
SODIUM: 117 mmol/L — AB (ref 135–145)
TOTAL PROTEIN: 7.4 g/dL (ref 6.5–8.1)

## 2017-09-03 LAB — CBC
HCT: 37.4 % — ABNORMAL LOW (ref 39.0–52.0)
Hemoglobin: 14.3 g/dL (ref 13.0–17.0)
MCH: 32.8 pg (ref 26.0–34.0)
MCHC: 38.2 g/dL — ABNORMAL HIGH (ref 30.0–36.0)
MCV: 85.8 fL (ref 78.0–100.0)
PLATELETS: 278 10*3/uL (ref 150–400)
RBC: 4.36 MIL/uL (ref 4.22–5.81)
RDW: 12.9 % (ref 11.5–15.5)
WBC: 5.6 10*3/uL (ref 4.0–10.5)

## 2017-09-03 LAB — URINALYSIS, ROUTINE W REFLEX MICROSCOPIC
Bilirubin Urine: NEGATIVE
GLUCOSE, UA: NEGATIVE mg/dL
Hgb urine dipstick: NEGATIVE
KETONES UR: 5 mg/dL — AB
LEUKOCYTES UA: NEGATIVE
NITRITE: NEGATIVE
PH: 5 (ref 5.0–8.0)
Protein, ur: NEGATIVE mg/dL
SPECIFIC GRAVITY, URINE: 1.009 (ref 1.005–1.030)

## 2017-09-03 LAB — LIPASE, BLOOD: Lipase: 18 U/L (ref 11–51)

## 2017-09-03 MED ORDER — GI COCKTAIL ~~LOC~~
30.0000 mL | Freq: Once | ORAL | Status: AC
  Administered 2017-09-03: 30 mL via ORAL
  Filled 2017-09-03: qty 30

## 2017-09-03 MED ORDER — SODIUM CHLORIDE 0.9 % IV BOLUS (SEPSIS)
1000.0000 mL | Freq: Once | INTRAVENOUS | Status: AC
  Administered 2017-09-03: 1000 mL via INTRAVENOUS

## 2017-09-03 MED ORDER — ONDANSETRON 4 MG PO TBDP
4.0000 mg | ORAL_TABLET | Freq: Once | ORAL | Status: AC | PRN
  Administered 2017-09-03: 4 mg via ORAL
  Filled 2017-09-03: qty 1

## 2017-09-03 MED ORDER — POTASSIUM CHLORIDE 10 MEQ/100ML IV SOLN
10.0000 meq | Freq: Once | INTRAVENOUS | Status: AC
  Administered 2017-09-03: 10 meq via INTRAVENOUS
  Filled 2017-09-03: qty 100

## 2017-09-03 MED ORDER — GI COCKTAIL ~~LOC~~: 30.0000 mL | Freq: Once | ORAL | Status: DC

## 2017-09-03 MED ORDER — POTASSIUM CHLORIDE CRYS ER 20 MEQ PO TBCR
40.0000 meq | EXTENDED_RELEASE_TABLET | Freq: Once | ORAL | Status: AC
  Administered 2017-09-04: 40 meq via ORAL
  Filled 2017-09-03: qty 2

## 2017-09-03 MED ORDER — SODIUM CHLORIDE 0.9 % IV BOLUS (SEPSIS): 1000.0000 mL | Freq: Once | INTRAVENOUS | Status: DC

## 2017-09-03 MED ORDER — METOCLOPRAMIDE HCL 5 MG/ML IJ SOLN
10.0000 mg | Freq: Once | INTRAMUSCULAR | Status: AC
  Administered 2017-09-03: 10 mg via INTRAVENOUS
  Filled 2017-09-03: qty 2

## 2017-09-03 MED ORDER — SODIUM CHLORIDE 0.9 % IV BOLUS (SEPSIS)
1000.0000 mL | Freq: Once | INTRAVENOUS | Status: AC
  Administered 2017-09-04: 1000 mL via INTRAVENOUS

## 2017-09-03 MED ORDER — THIAMINE HCL 100 MG/ML IJ SOLN
100.0000 mg | Freq: Once | INTRAMUSCULAR | Status: AC
  Administered 2017-09-03: 100 mg via INTRAVENOUS
  Filled 2017-09-03: qty 2

## 2017-09-03 NOTE — ED Triage Notes (Signed)
Pt brought in by EMS with c/o nausea and vomiting since last night  Pt also c/o sore throat

## 2017-09-03 NOTE — ED Notes (Signed)
Pt given urinal and made aware of need for urine specimen 

## 2017-09-03 NOTE — ED Provider Notes (Signed)
[facility] DE[facility] Provider Note   CSN: [id number] Arrival date & time: 09/03/17  2050     History   Chief Complaint Chief Complaint  Patient presents with  . Emesis    HPI Clayton Alvarez is a 56 y.o. male.  HPI  56 y.o. male, presents to the Emergency Department today via EMS due to N/V with onset last night. Noted hx same. Pt states he started developing hiccups and this caused the emesis. Noted mild epigastric abdominal pain. Notes burning in throat, but attributes this to emesis. No CP/SOB. Notes diarrhea x 2 last night. Improving. Does endorse ETOH consumption. Drinks two 40oz beers daily. Pt states he was able to drink one 40oz today. No fevers. No cough/xcongestion. No melena. No hematochezia. No other symptoms noted.    History reviewed. No pertinent past medical history.  There are no active problems to display for this patient.   History reviewed. No pertinent surgical history.     Home Medications    Prior to Admission medications   Medication Sig Start Date End Date Taking? Authorizing Provider  acetaminophen (TYLENOL) 325 MG tablet Take 650 mg by mouth every 6 (six) hours as needed. For hip pain    [provider]    Family History History reviewed. No pertinent family history.  Social History Social History  Substance Use Topics  . Smoking status: Current Every Day Smoker  . Smokeless tobacco: Never Used  . Alcohol use 43.2 oz/week    72 Cans of beer per week     Allergies   Patient has no known allergies.   Review of Systems Review of Systems ROS reviewed and all are negative for acute change except as noted in the HPI.  Physical Exam Updated Vital Signs BP (!) 133/97 (BP Location: Left Arm)   Pulse 100   Temp 98.3 F (36.8 C) (Oral)   Resp (!) 24   SpO2 100%   Physical Exam  Constitutional: He is oriented to person, place, and time. Vital signs are normal. He appears well-developed and well-nourished. No distress.    HENT:  Head: Normocephalic and atraumatic.  Right Ear: Hearing, tympanic membrane, external ear and ear canal normal.  Left Ear: Hearing, tympanic membrane, external ear and ear canal normal.  Nose: Nose normal.  Mouth/Throat: Uvula is midline, oropharynx is clear and moist and mucous membranes are normal. No trismus in the jaw. No oropharyngeal exudate, posterior oropharyngeal erythema or tonsillar abscesses.  Eyes: Pupils are equal, round, and reactive to light. Conjunctivae and EOM are normal.  Neck: Normal range of motion. Neck supple. No tracheal deviation present.  Cardiovascular: Normal rate, regular rhythm, S1 normal, S2 normal, normal heart sounds, intact distal pulses and normal pulses.   Pulmonary/Chest: Effort normal and breath sounds normal. No respiratory distress. He has no decreased breath sounds. He has no wheezes. He has no rhonchi. He has no rales.  Abdominal: Soft. Normal appearance and bowel sounds are normal. There is tenderness in the epigastric area. There is no rigidity, no rebound, no guarding, no CVA tenderness, no tenderness at McBurney's point and negative Murphy's sign.  Abdomen soft  Musculoskeletal: Normal range of motion.  Neurological: He is alert and oriented to person, place, and time.  Skin: Skin is warm and dry.  Psychiatric: He has a normal mood and affect. His speech is normal and behavior is normal. Thought content normal.  Nursing note and vitals reviewed.  ED Treatments / Results  Labs (all labs ordered are  listed, but only abnormal results are displayed) Labs Reviewed  COMPREHENSIVE METABOLIC PANEL - Abnormal; Notable for the following:       Result Value   Sodium 117 (*)    Potassium 2.9 (*)    Chloride 80 (*)    Glucose, Bld 101 (*)    Calcium 8.2 (*)    AST 69 (*)    Alkaline Phosphatase 160 (*)    All other components within normal limits  CBC - Abnormal; Notable for the following:    HCT 37.4 (*)    MCHC 38.2 (*)    All other  components within normal limits  LIPASE, BLOOD  URINALYSIS, ROUTINE W REFLEX MICROSCOPIC    EKG  EKG Interpretation None       Radiology No results found.  Procedures Procedures (including critical care time)  CRITICAL CARE Performed by: Ozella Rocks   Total critical care time: 35 minutes  Critical care time was exclusive of separately billable procedures and treating other patients.  Critical care was necessary to treat or prevent imminent or life-threatening deterioration.  Critical care was time spent personally by me on the following activities: development of treatment plan with patient and/or surrogate as well as nursing, discussions with consultants, evaluation of patient's response to treatment, examination of patient, obtaining history from patient or surrogate, ordering and performing treatments and interventions, ordering and review of laboratory studies, ordering and review of radiographic studies, pulse oximetry and re-evaluation of patient's condition.   Medications Ordered in ED Medications  metoCLOPramide (REGLAN) injection 10 mg (not administered)  sodium chloride 0.9 % bolus 1,000 mL (not administered)  sodium chloride 0.9 % bolus 1,000 mL (not administered)  potassium chloride 10 mEq in 100 mL IVPB (not administered)  potassium chloride SA (K-DUR,KLOR-CON) CR tablet 40 mEq (not administered)  thiamine (B-1) injection 100 mg (not administered)  ondansetron (ZOFRAN-ODT) disintegrating tablet 4 mg (4 mg Oral Given 09/03/17 2120)     Initial Impression / Assessment and Plan / ED Course  I have reviewed the triage vital signs and the nursing notes.  Pertinent labs & imaging results that were available during my care of the patient were reviewed by me and considered in my medical decision making (see chart for details).  Final Clinical Impressions(s) / ED Diagnoses  {I have reviewed and evaluated the relevant laboratory values.   {I have reviewed the  relevant previous healthcare records.  {I obtained HPI from historian.   ED Course:  Assessment: Pt is a 56 y.o. male presents to the Emergency Department today via EMS due to N/V with onset last night. Noted hx same. Pt states he started developing hiccups and this caused the emesis. Noted mild epigastric abdominal pain. Notes burning in throat, but attributes this to emesis. No CP/SOB. Notes diarrhea x 2 last night. Improving. Does endorse ETOH consumption. Drinks two 40oz beers daily. Pt states he was able to drink one 40oz today. No fevers. No cough/congestion. No melena. No hematochezia. No hematemesis. On exam, pt in NAD. Nontoxic/nonseptic appearing. VSS. Afebrile. Lungs CTA. Heart RRR. Abdomen with mild epigastric tenderness. Soft. CBC unremarkable. CMP shows sodium 117. Given 2L NS Bolus. No headaches. No visual changes. Potassium 2.9. Given PO/IV potassium. Alk Phos 160. Likely 2/2 ETOH. Discussed with attending physician. Will admit to medicine  Disposition/Plan:  Admit Pt acknowledges and agrees with plan  Supervising Physician Blanchie Dessert, MD  Final diagnoses:  Hyponatremia  Hypokalemia  Nausea and vomiting, intractability of vomiting not specified, unspecified  vomiting type  Acute alcoholic gastritis, presence of bleeding unspecified    New Prescriptions New Prescriptions   No medications on file     Shary Decamp, Hershal Coria 09/04/17 [place], MD 09/09/17 2048

## 2017-09-04 ENCOUNTER — Encounter (HOSPITAL_COMMUNITY): Payer: Self-pay | Admitting: Internal Medicine

## 2017-09-04 DIAGNOSIS — F14129 Cocaine abuse with intoxication, unspecified: Secondary | ICD-10-CM | POA: Diagnosis present

## 2017-09-04 DIAGNOSIS — R918 Other nonspecific abnormal finding of lung field: Secondary | ICD-10-CM | POA: Diagnosis present

## 2017-09-04 DIAGNOSIS — E876 Hypokalemia: Secondary | ICD-10-CM | POA: Diagnosis present

## 2017-09-04 DIAGNOSIS — R000 Tachycardia, unspecified: Secondary | ICD-10-CM | POA: Diagnosis present

## 2017-09-04 DIAGNOSIS — Z720 Tobacco use: Secondary | ICD-10-CM | POA: Diagnosis not present

## 2017-09-04 DIAGNOSIS — R509 Fever, unspecified: Secondary | ICD-10-CM | POA: Diagnosis present

## 2017-09-04 DIAGNOSIS — F10239 Alcohol dependence with withdrawal, unspecified: Secondary | ICD-10-CM | POA: Diagnosis present

## 2017-09-04 DIAGNOSIS — F1010 Alcohol abuse, uncomplicated: Secondary | ICD-10-CM | POA: Diagnosis present

## 2017-09-04 DIAGNOSIS — M1611 Unilateral primary osteoarthritis, right hip: Secondary | ICD-10-CM | POA: Diagnosis present

## 2017-09-04 DIAGNOSIS — R112 Nausea with vomiting, unspecified: Secondary | ICD-10-CM

## 2017-09-04 DIAGNOSIS — K2920 Alcoholic gastritis without bleeding: Secondary | ICD-10-CM

## 2017-09-04 DIAGNOSIS — R109 Unspecified abdominal pain: Secondary | ICD-10-CM | POA: Diagnosis present

## 2017-09-04 DIAGNOSIS — F17200 Nicotine dependence, unspecified, uncomplicated: Secondary | ICD-10-CM | POA: Diagnosis present

## 2017-09-04 DIAGNOSIS — E860 Dehydration: Secondary | ICD-10-CM | POA: Diagnosis present

## 2017-09-04 DIAGNOSIS — E871 Hypo-osmolality and hyponatremia: Secondary | ICD-10-CM | POA: Diagnosis present

## 2017-09-04 DIAGNOSIS — Z79899 Other long term (current) drug therapy: Secondary | ICD-10-CM | POA: Diagnosis not present

## 2017-09-04 DIAGNOSIS — I10 Essential (primary) hypertension: Secondary | ICD-10-CM | POA: Diagnosis present

## 2017-09-04 DIAGNOSIS — Z781 Physical restraint status: Secondary | ICD-10-CM | POA: Diagnosis not present

## 2017-09-04 DIAGNOSIS — E43 Unspecified severe protein-calorie malnutrition: Secondary | ICD-10-CM | POA: Diagnosis present

## 2017-09-04 DIAGNOSIS — Z681 Body mass index (BMI) 19 or less, adult: Secondary | ICD-10-CM | POA: Diagnosis not present

## 2017-09-04 DIAGNOSIS — R066 Hiccough: Secondary | ICD-10-CM | POA: Diagnosis present

## 2017-09-04 LAB — BASIC METABOLIC PANEL
ANION GAP: 10 (ref 5–15)
ANION GAP: 10 (ref 5–15)
Anion gap: 10 (ref 5–15)
BUN: 6 mg/dL (ref 6–20)
BUN: 6 mg/dL (ref 6–20)
BUN: <5 mg/dL — ABNORMAL LOW (ref 6–20)
CALCIUM: 7.8 mg/dL — AB (ref 8.9–10.3)
CHLORIDE: 101 mmol/L (ref 101–111)
CHLORIDE: 98 mmol/L — AB (ref 101–111)
CO2: 26 mmol/L (ref 22–32)
CO2: 20 mmol/L — ABNORMAL LOW (ref 22–32)
CO2: 21 mmol/L — AB (ref 22–32)
CREATININE: 0.7 mg/dL (ref 0.61–1.24)
CREATININE: 0.59 mg/dL — AB (ref 0.61–1.24)
Calcium: 7.8 mg/dL — ABNORMAL LOW (ref 8.9–10.3)
Calcium: 7.9 mg/dL — ABNORMAL LOW (ref 8.9–10.3)
Chloride: 94 mmol/L — ABNORMAL LOW (ref 101–111)
Creatinine, Ser: 0.56 mg/dL — ABNORMAL LOW (ref 0.61–1.24)
GFR calc Af Amer: >60 mL/min (ref >60)
GFR calc Af Amer: >60 mL/min (ref >60)
GFR calc Af Amer: >60 mL/min (ref >60)
GFR calc non Af Amer: >60 mL/min (ref >60)
GFR calc non Af Amer: >60 mL/min (ref >60)
GLUCOSE: 92 mg/dL (ref 65–99)
Glucose, Bld: 78 mg/dL (ref 65–99)
Glucose, Bld: 86 mg/dL (ref 65–99)
POTASSIUM: 3.3 mmol/L — AB (ref 3.5–5.1)
POTASSIUM: 4.1 mmol/L (ref 3.5–5.1)
Potassium: 4.1 mmol/L (ref 3.5–5.1)
SODIUM: 130 mmol/L — AB (ref 135–145)
SODIUM: 131 mmol/L — AB (ref 135–145)
Sodium: 129 mmol/L — ABNORMAL LOW (ref 135–145)

## 2017-09-04 LAB — CBC
HEMATOCRIT: 37.3 % — AB (ref 39.0–52.0)
HEMOGLOBIN: 13.7 g/dL (ref 13.0–17.0)
MCH: 32.2 pg (ref 26.0–34.0)
MCHC: 36.7 g/dL — ABNORMAL HIGH (ref 30.0–36.0)
MCV: 87.6 fL (ref 78.0–100.0)
PLATELETS: 264 10*3/uL (ref 150–400)
RBC: 4.26 MIL/uL (ref 4.22–5.81)
RDW: 13 % (ref 11.5–15.5)
WBC: 6.4 10*3/uL (ref 4.0–10.5)

## 2017-09-04 LAB — RAPID URINE DRUG SCREEN, HOSP PERFORMED
AMPHETAMINES: NOT DETECTED
BENZODIAZEPINES: NOT DETECTED
Barbiturates: NOT DETECTED
Cocaine: POSITIVE — AB
Opiates: NOT DETECTED
TETRAHYDROCANNABINOL: NOT DETECTED

## 2017-09-04 LAB — GLUCOSE, CAPILLARY: GLUCOSE-CAPILLARY: 99 mg/dL (ref 65–99)

## 2017-09-04 LAB — TSH: TSH: 1.264 u[IU]/mL (ref 0.350–4.500)

## 2017-09-04 LAB — MAGNESIUM: Magnesium: 1.5 mg/dL — ABNORMAL LOW (ref 1.7–2.4)

## 2017-09-04 LAB — HIV ANTIBODY (ROUTINE TESTING W REFLEX): HIV Screen 4th Generation wRfx: NONREACTIVE

## 2017-09-04 MED ORDER — ENOXAPARIN SODIUM 40 MG/0.4ML ~~LOC~~ SOLN
40.0000 mg | SUBCUTANEOUS | Status: DC
  Administered 2017-09-04 – 2017-09-08 (×4): 40 mg via SUBCUTANEOUS
  Filled 2017-09-04 (×5): qty 0.4

## 2017-09-04 MED ORDER — LORAZEPAM 2 MG/ML IJ SOLN
0.0000 mg | Freq: Four times a day (QID) | INTRAMUSCULAR | Status: DC
  Administered 2017-09-04 (×3): 1 mg via INTRAVENOUS
  Filled 2017-09-04 (×3): qty 1

## 2017-09-04 MED ORDER — THIAMINE HCL 100 MG/ML IJ SOLN
100.0000 mg | Freq: Every day | INTRAMUSCULAR | Status: DC
  Administered 2017-09-04 – 2017-09-05 (×2): 100 mg via INTRAVENOUS
  Filled 2017-09-04 (×2): qty 2

## 2017-09-04 MED ORDER — PANTOPRAZOLE SODIUM 40 MG IV SOLR
40.0000 mg | Freq: Two times a day (BID) | INTRAVENOUS | Status: DC
  Administered 2017-09-04 – 2017-09-07 (×8): 40 mg via INTRAVENOUS
  Filled 2017-09-04 (×8): qty 40

## 2017-09-04 MED ORDER — POTASSIUM CHLORIDE CRYS ER 20 MEQ PO TBCR
40.0000 meq | EXTENDED_RELEASE_TABLET | Freq: Once | ORAL | Status: AC
  Administered 2017-09-04: 40 meq via ORAL
  Filled 2017-09-04: qty 2

## 2017-09-04 MED ORDER — VITAMIN B-1 100 MG PO TABS
100.0000 mg | ORAL_TABLET | Freq: Every day | ORAL | Status: DC
  Administered 2017-09-06 – 2017-09-08 (×3): 100 mg via ORAL
  Filled 2017-09-04 (×4): qty 1

## 2017-09-04 MED ORDER — LORAZEPAM 2 MG/ML IJ SOLN
0.0000 mg | Freq: Four times a day (QID) | INTRAMUSCULAR | Status: DC
Start: 2017-09-05 — End: 2017-09-05
  Administered 2017-09-04 – 2017-09-05 (×2): 2 mg via INTRAMUSCULAR
  Filled 2017-09-04 (×4): qty 1

## 2017-09-04 MED ORDER — POLYVINYL ALCOHOL 1.4 % OP SOLN
1.0000 [drp] | OPHTHALMIC | Status: DC | PRN
  Filled 2017-09-04: qty 15

## 2017-09-04 MED ORDER — LORAZEPAM 2 MG/ML IJ SOLN
1.0000 mg | Freq: Once | INTRAMUSCULAR | Status: AC
  Administered 2017-09-04: 1 mg via INTRAVENOUS
  Filled 2017-09-04: qty 1

## 2017-09-04 MED ORDER — NICOTINE 21 MG/24HR TD PT24
21.0000 mg | MEDICATED_PATCH | Freq: Every day | TRANSDERMAL | Status: DC
  Administered 2017-09-04 – 2017-09-08 (×6): 21 mg via TRANSDERMAL
  Filled 2017-09-04 (×6): qty 1

## 2017-09-04 MED ORDER — FOLIC ACID 1 MG PO TABS
1.0000 mg | ORAL_TABLET | Freq: Every day | ORAL | Status: DC
  Administered 2017-09-04 – 2017-09-08 (×4): 1 mg via ORAL
  Filled 2017-09-04 (×4): qty 1

## 2017-09-04 MED ORDER — LORAZEPAM 1 MG PO TABS: 1.0000 mg | ORAL_TABLET | Freq: Four times a day (QID) | ORAL | Status: DC | PRN

## 2017-09-04 MED ORDER — MORPHINE SULFATE (PF) 4 MG/ML IV SOLN
2.0000 mg | INTRAVENOUS | Status: DC | PRN
  Administered 2017-09-04 – 2017-09-05 (×4): 2 mg via INTRAVENOUS
  Filled 2017-09-04 (×4): qty 1

## 2017-09-04 MED ORDER — ADULT MULTIVITAMIN W/MINERALS CH
1.0000 | ORAL_TABLET | Freq: Every day | ORAL | Status: DC
  Administered 2017-09-04 – 2017-09-08 (×4): 1 via ORAL
  Filled 2017-09-04 (×4): qty 1

## 2017-09-04 MED ORDER — SODIUM CHLORIDE 0.9 % IV SOLN
INTRAVENOUS | Status: DC
  Administered 2017-09-04: 03:00:00 via INTRAVENOUS

## 2017-09-04 MED ORDER — LORAZEPAM 2 MG/ML IJ SOLN: 0.0000 mg | Freq: Four times a day (QID) | INTRAMUSCULAR | Status: DC

## 2017-09-04 MED ORDER — LORAZEPAM 2 MG/ML IJ SOLN: 0.0000 mg | Freq: Two times a day (BID) | INTRAMUSCULAR | Status: DC

## 2017-09-04 MED ORDER — MAGNESIUM SULFATE IN D5W 1-5 GM/100ML-% IV SOLN
1.0000 g | Freq: Once | INTRAVENOUS | Status: AC
  Administered 2017-09-04: 1 g via INTRAVENOUS
  Filled 2017-09-04: qty 100

## 2017-09-04 MED ORDER — LORAZEPAM 2 MG/ML IJ SOLN
1.0000 mg | Freq: Four times a day (QID) | INTRAMUSCULAR | Status: DC | PRN
  Administered 2017-09-05 (×3): 1 mg via INTRAMUSCULAR
  Filled 2017-09-04 (×3): qty 1

## 2017-09-04 MED ORDER — DEXTROSE 5 % IV SOLN
INTRAVENOUS | Status: AC
  Administered 2017-09-04: 100 mL via INTRAVENOUS
  Administered 2017-09-04: 20:00:00 via INTRAVENOUS

## 2017-09-04 MED ORDER — BOOST / RESOURCE BREEZE PO LIQD
1.0000 | Freq: Three times a day (TID) | ORAL | Status: DC
  Administered 2017-09-04 – 2017-09-08 (×9): 1 via ORAL

## 2017-09-04 MED ORDER — LORAZEPAM 2 MG/ML IJ SOLN: 1.0000 mg | Freq: Four times a day (QID) | INTRAMUSCULAR | Status: DC | PRN

## 2017-09-04 MED ORDER — CHLORPROMAZINE HCL 10 MG PO TABS
10.0000 mg | ORAL_TABLET | Freq: Four times a day (QID) | ORAL | Status: DC | PRN
  Administered 2017-09-04 – 2017-09-06 (×4): 10 mg via ORAL
  Filled 2017-09-04 (×4): qty 1

## 2017-09-04 MED ORDER — DEXTROSE 5 % IV SOLN
INTRAVENOUS | Status: DC
  Administered 2017-09-04: 07:00:00 via INTRAVENOUS

## 2017-09-04 MED ORDER — ZOLPIDEM TARTRATE 5 MG PO TABS
5.0000 mg | ORAL_TABLET | Freq: Every evening | ORAL | Status: DC | PRN
  Administered 2017-09-05: 5 mg via ORAL
  Filled 2017-09-04: qty 1

## 2017-09-04 MED ORDER — ONDANSETRON 4 MG PO TBDP: 4.0000 mg | ORAL_TABLET | Freq: Once | ORAL | Status: DC | PRN

## 2017-09-04 NOTE — Care Management Note (Signed)
Case Management Note  Patient Details  Name: Clayton Alvarez MRN: [id number] Date of Birth: 11-30-1961  Subjective/Objective:                  Nausea, vomiting,hiccups,abd pain  Action/Plan: Date:  September 04, 2017 Chart reviewed for concurrent status and case management needs. Will continue to follow patient progress. Discharge Planning: following for needs Expected discharge date: [id number] Marcelle Smiling, BSN, [place], [place]   [phone]  Expected Discharge Date:                  Expected Discharge Plan:  Home/Self Care  In-House Referral:     Discharge planning Services  CM Consult  Post Acute Care Choice:    Choice offered to:     DME Arranged:    DME Agency:     HH Arranged:    HH Agency:     Status of Service:  In process, will continue to follow  If discussed at Long Length of Stay Meetings, dates discussed:    Additional Comments:  Golda Acre, RN 09/04/2017, 8:39 AM

## 2017-09-04 NOTE — Progress Notes (Signed)
Initial Nutrition Assessment  DOCUMENTATION CODES:   Severe malnutrition in context of social or environmental circumstances  INTERVENTION:    Boost Breeze po TID, each supplement provides 250 kcal and 9 grams of protein  Provide MVI daily  Monitor and supplement electrolytes as need per MD descretion.   Monitor for diet advancement and toleration  When upgraded RD to add:   Ensure Enlive po BID, each supplement provides 350 kcal and 20 grams of protein   Magic cup TID with meals, each supplement provides 290 kcal and 9 grams of protein  NUTRITION DIAGNOSIS:   Malnutrition (Severe) related to social / environmental circumstances as evidenced by energy intake < or equal to 50% for > or equal to 1 month, severe depletion of body fat, percent weight loss, severe depletion of muscle mass.  GOAL:   Patient will meet greater than or equal to 90% of their needs  MONITOR:   PO intake, Supplement acceptance, Labs, Weight trends, Diet advancement  REASON FOR ASSESSMENT:   Malnutrition Screening Tool    ASSESSMENT:   Pt with PMH of alcohol abuse and tobacco abuse. Presents this admission with nausea, vomiting, and abdominal pain. Admitted for hyponatremia most likely due to GI loss of vomiting, diarrhea, and decaresed oral intake.   Spoke with pt at bedside who reports having decrease appetite for > 3 months related to excessive alcohol consumption. States he typically drinks two 40 oz beers a day. Consumes 1-2 meals per day that consist of soft foods like canned vegetables and chicken. Pt has difficulty eating certain textures related to multiple missing teeth. Pt lives at home alone and cooks for himself. Discussed the effects that alcohol have on nutrition status and the importance of protein intake for preservation of lean body mass given his malnourished state. Included protein supplement information in discharge instructions.   Records indicate pt has lost 26% of body wt in 5  months. This percentage in this time frame is significant. Nutrition-Focused physical exam completed. Findings are severe fat depletion, severe muscle depletion, and no edema. Pt magnesium noted to be low. Pt is at high risk for refeeding. Monitor and supplement electrolytes as need per MD descretion.   D5 is providing pt with extra 408 kcals.  Medications reviewed and include: folic acid, MVI with minerals, thiamine, D5 @ 100 ml/hr Labs reviewed: Na 131 (L) Creatinine 0.59 (L) Mg 1.5 (L) ALP 160 (H) AST 69 (H)  Diet Order:  Diet clear liquid Room service appropriate? Yes; Fluid consistency: Thin  Skin:  Reviewed, no issues  Last BM:  PTA  Height:   Ht Readings from Last 1 Encounters:  09/04/17 6' (1.829 m)    Weight:   Wt Readings from Last 1 Encounters:  09/04/17 119 lb 14.4 oz (54.4 kg)    Ideal Body Weight:  80.9 kg  BMI:  Body mass index is 16.26 kg/m.  Estimated Nutritional Needs:   Kcal:   2300-2500 (29-32 kcal/kg IBW)  Protein:  125-135 grams (1.5-1.7 g/kg IBW)  Fluid:  >2.3 L/day  EDUCATION NEEDS:   Education needs addressed  Vanessa Kick RD, LDN Clinical Nutrition Pager # [phone]

## 2017-09-04 NOTE — H&P (Signed)
History and Physical    PAYNE GARSKE ZOX:[id number] DOB: 1961-11-21 DOA: 09/03/2017  Referring MD/NP/PA:   PCP: Default, Provider, MD   Patient coming from:  The patient is coming from home.  At baseline, pt is independent for most of ADL.   Chief Complaint: Nausea, vomiting, abdominal pain, Hiccups   HPI: Clayton Alvarez is a 56 y.o. male with medical history significant of alcohol abuse, tobacco abuse, who presents with nausea, vomiting abdominal pain.  Patient states that he has been having nausea, vomiting and abdominal pain since last night. He vomited more than 10 times without blood in the vomitus. His abdominal pain is located in the epigastric area, constant, moderate, nonradiating. Patient states that he had four diarrhea yesterday, which has resolved. He also reports intractable hiccups. Currently no diarrhea. Patient does not have chest pain or shortness of breath. No cough, fever or chills. No symptoms of UTI or unilateral weakness.   ED Course: pt was found to have Potassium 2.9, sodium 117, creatinine normal, WBC 5.6, negative urinalysis, temperature normal, tachycardia, tachypnea, oxygen saturation 100% on room air. Patient is placed on telemetry bed for observation.   Review of Systems:   General: no fevers, chills, no body weight gain, has poor appetite, has fatigue HEENT: no blurry vision, hearing changes or sore throat Respiratory: no dyspnea, coughing, wheezing CV: no chest pain, no palpitations GI: has nausea, vomiting, abdominal pain, diarrhea, no constipation GU: no dysuria, burning on urination, increased urinary frequency, hematuria  Ext: no leg edema Neuro: no unilateral weakness, numbness, or tingling, no vision change or hearing loss Skin: no rash, no skin tear. MSK: No muscle spasm, no deformity, no limitation of range of movement in spin Heme: No easy bruising.  Travel history: No recent long distant travel.  Allergy: No Known Allergies  Past  Medical History:  Diagnosis Date  . Alcohol abuse     Past Surgical History:  Procedure Laterality Date  . FACIAL LACERATION REPAIR Left     Social History:  reports that he has been smoking.  He has never used smokeless tobacco. He reports that he drinks about 43.2 oz of alcohol per week . He reports that he does not use drugs.  Family History: Reviewed with patient, but patient does not know any family medical history.  Prior to Admission medications   Medication Sig Start Date End Date Taking? Authorizing Provider  acetaminophen (TYLENOL) 325 MG tablet Take 650 mg by mouth every 6 (six) hours as needed. For hip pain   Yes [provider]  polyvinyl alcohol (LIQUIFILM TEARS) 1.4 % ophthalmic solution Place 1 drop into both eyes as needed for dry eyes.   Yes [provider]    Physical Exam: Vitals:   09/03/17 2102 09/03/17 2239 09/04/17 0018 09/04/17 0152  BP: (!) 133/97 (!) 150/104 (!) 146/118   Pulse: 100 92 83   Resp: (!) Temp: 98.3 F (36.8 C)   98.5 F (36.9 C)  TempSrc: Oral   Oral  SpO2: 100% 100% 100%    General: Not in acute distress HEENT:       Eyes: PERRL, EOMI, no scleral icterus.       ENT: No discharge from the ears and nose, no pharynx injection, no tonsillar enlargement.        Neck: No JVD, no bruit, no mass felt. Heme: No neck lymph node enlargement. Cardiac: S1/S2, RRR, No murmurs, No gallops or rubs. Respiratory:  No  rales, wheezing, rhonchi or rubs. GI: Soft, nondistended, has tenderness in epigastric area, no rebound pain, no organomegaly, BS present. GU: No hematuria Ext: No pitting leg edema bilaterally. 2+DP/PT pulse bilaterally. Musculoskeletal: No joint deformities, No joint redness or warmth, no limitation of ROM in spin. Skin: No rashes.  Neuro: Alert, oriented X3, cranial nerves II-XII grossly intact, moves all extremities normally.  Psych: Patient is not psychotic, no suicidal or hemocidal ideation.  Labs  on Admission: I have personally reviewed following labs and imaging studies  CBC:  Recent Labs Lab 09/03/17 2108  WBC 5.6  HGB 14.3  HCT 37.4*  MCV 85.8  PLT 278   Basic Metabolic Panel:  Recent Labs Lab 09/03/17 2108  NA 117*  K 2.9*  CL 80*  CO2 22  GLUCOSE 101*  BUN 7  CREATININE 0.78  CALCIUM 8.2*   GFR: CrCl cannot be calculated (Unknown ideal weight.). Liver Function Tests:  Recent Labs Lab 09/03/17 2108  AST 69*  ALT 46  ALKPHOS 160*  BILITOT 1.0  PROT 7.4  ALBUMIN 3.6    Recent Labs Lab 09/03/17 2108  LIPASE 18   No results for input(s): AMMONIA in the last 168 hours. Coagulation Profile: No results for input(s): INR, PROTIME in the last 168 hours. Cardiac Enzymes: No results for input(s): CKTOTAL, CKMB, CKMBINDEX, TROPONINI in the last 168 hours. BNP (last 3 results) No results for input(s): PROBNP in the last 8760 hours. HbA1C: No results for input(s): HGBA1C in the last 72 hours. CBG: No results for input(s): GLUCAP in the last 168 hours. Lipid Profile: No results for input(s): CHOL, HDL, LDLCALC, TRIG, CHOLHDL, LDLDIRECT in the last 72 hours. Thyroid Function Tests: No results for input(s): TSH, T4TOTAL, FREET4, T3FREE, THYROIDAB in the last 72 hours. Anemia Panel: No results for input(s): VITAMINB12, FOLATE, FERRITIN, TIBC, IRON, RETICCTPCT in the last 72 hours. Urine analysis:    Component Value Date/Time   COLORURINE YELLOW 09/03/2017 2059   APPEARANCEUR CLEAR 09/03/2017 2059   LABSPEC 1.009 09/03/2017 2059   PHURINE 5.0 09/03/2017 2059   GLUCOSEU NEGATIVE 09/03/2017 2059   HGBUR NEGATIVE 09/03/2017 2059   BILIRUBINUR NEGATIVE 09/03/2017 2059   KETONESUR 5 (A) 09/03/2017 2059   PROTEINUR NEGATIVE 09/03/2017 2059   NITRITE NEGATIVE 09/03/2017 2059   LEUKOCYTESUR NEGATIVE 09/03/2017 2059   Sepsis Labs: (procalcitonin:4,lacticidven:4) )No results found for this or any previous visit (from the past 240 hour(s)).    Radiological Exams on Admission: No results found.   EKG: Not done in ED, will get one.   Assessment/Plan Principal Problem:   Hyponatremia Active Problems:   Alcohol abuse   Hypokalemia   Nausea & vomiting   Abdominal pain   Tobacco abuse   Hyponatremia: Sodium 117. Mental status normal. Most likely due to multifactorial etiology, including GI loss from nausea vomiting and diarrhea, decreased oral intake and potomania. Patient received 2 L normal saline bolus in ED, repeated to be labs showed sodium 130, indicating correction is too fast.  - Will place on tele bed for obs - check TSH - start D5 at 100 cc/h - f/u by BMP at 9:00 AM  Hypokalemia: K= 2.9 on admission. - Repleted with total of 90 mEq of KCl - give magnesium sulfate 1 g - Check Mg level  Nausea & vomiting and diarrhea and abdominal pain:  Abdominal pain is likely due to alcoholic gastritis. Nausea, vomiting and diarrhea may be partially due to gastritis versus viral gastroenteritis. No fever or leukocytosis. Less  likely to have C. difficile colitis. -IV fluids: As above -When necessary Zofran for nausea and morphine for pain -Thorazine for hiccups - Protonix 40 mg twice a day by IV -Check lipase  Tobacco abuse and Alcohol abuse: -Did counseling about importance of quitting smoking -Nicotine patch -Did counseling about the importance of quitting drinking -CIWA protocol    DVT ppx: SQ Lovenox Code Status: Full code Family Communication: None at bed side.       Disposition Plan:  Anticipate discharge back to previous home environment Consults called:  None Admission status: Obs / tele    Date of Service 09/04/2017    Lorretta Harp [facility] Pager [phone]  If 7PM-7AM, please contact night-coverage [email or web address] Password [email or web address] 09/04/2017, 1:54 AM

## 2017-09-04 NOTE — Discharge Instructions (Signed)

## 2017-09-04 NOTE — Progress Notes (Signed)
[facility] PROGRESS NOTE    Progress Note  Clayton Alvarez  RUE:[id number] DOB: May 21, 1961 DOA: 09/03/2017 PCP: Default, Provider, MD     Brief Narrative:   Clayton Alvarez is an 56 y.o. male with medical history significant of alcohol abuse, tobacco abuse, who presents with nausea, vomiting abdominal pain.Basic metabolic panel shows a sodium 147 ADD.  Assessment/Plan:   Hyponatremia: On admission his sodium was 117 2 L of normal saline albumin was done in the ED that showed a sodium of 130, he was started on D5 place on telemetry with no events. Basic metabolic panel and 9 AM showed a sodium of 131. We'll continue D5 at 100 mL an hour for an additional 24 hours, check a basic metabolic panel in 4 hours.  Alcohol abuse: Continue monitor with Ativan protocol.  Hypokalemia: Repleted orally now is improved.  Nausea & vomiting Agreeable Protonix IV twice a day. Continue Zofran for nausea,Thorazine for hiccups.   DVT prophylaxis: lovenox Family Communication:none Disposition Plan/Barrier to D/C: home once he is tolerating orals and his sodium is improved. Code Status:     Code Status Orders        Start     Ordered   09/04/17 0103  Full code  Continuous     09/04/17 0103    Code Status History    Date Active Date Inactive Code Status Order ID Comments User Context   05/27/2012  6:52 PM 05/28/2012  1:14 AM Full Code [id number]  Shaaron Adler, PA-C ED   05/10/2012  6:25 PM 05/11/2012  2:23 AM Full Code [id number]  Linker, Malva Cogan, MD ED        IV Access:    Peripheral IV   Procedures and diagnostic studies:   No results found.   Medical Consultants:    None.  Anti-Infectives:   None  Subjective:    Clayton Alvarez resident continues to have a hiccup, but he would like to try something to eat he is hungry.  Objective:    Vitals:   09/04/17 0100 09/04/17 0152 09/04/17 0545 09/04/17 0910  BP: (!) 154/127 (!) 162/109 (!) 148/99  136/77  Pulse: (!) 129 86 78 (!) 103  Resp:  Temp:  98.5 F (36.9 C) 98.8 F (37.1 C) 98.9 F (37.2 C)  TempSrc:  Oral Oral Oral  SpO2: 97% 99% 100% 100%  Weight:  54.4 kg (119 lb 14.4 oz)    Height:  6' (1.829 m)      Intake/Output Summary (Last 24 hours) at 09/04/17 1007 Last data filed at 09/04/17 0823  Gross per 24 hour  Intake          2400.83 ml  Output                0 ml  Net          2400.83 ml   Filed Weights   09/04/17 0152  Weight: 54.4 kg (119 lb 14.4 oz)    Exam: General exam: In no acute distress.Poor oral hygiene. Respiratory system: Good air movement and clear to auscultation. Cardiovascular system: S1 & S2 heard, RRR.  Gastrointestinal system: Abdomen is nondistended, soft and nontender.  Central nervous system: Alert and oriented. No focal neurological deficits. Extremities: No pedal edema. Skin: No rashes, lesions or ulcers Psychiatry: Judgement and insight appear normal. Mood & affect appropriate.    Data Reviewed:    Labs: Basic Metabolic Panel:  Recent Labs Lab 09/03/17 2108  09/04/17 0223 09/04/17 0918  NA 117* 130* 131*  K 2.9* 3.3* 4.1  CL 80* 94* 101  CO2 22 26 20*  GLUCOSE 101* 78 86  BUN CREATININE 0.78 0.70 0.59*  CALCIUM 8.2* 7.8* 7.8*  MG  --  1.5*  --    GFR Estimated Creatinine Clearance: 80.3 mL/min (A) (by C-G formula based on SCr of 0.59 mg/dL (L)). Liver Function Tests:  Recent Labs Lab 09/03/17 2108  AST 69*  ALT 46  ALKPHOS 160*  BILITOT 1.0  PROT 7.4  ALBUMIN 3.6    Recent Labs Lab 09/03/17 2108  LIPASE 18   No results for input(s): AMMONIA in the last 168 hours. Coagulation profile No results for input(s): INR, PROTIME in the last 168 hours.  CBC:  Recent Labs Lab 09/03/17 2108 09/04/17 0223  WBC 5.6 6.4  HGB 14.3 13.7  HCT 37.4* 37.3*  MCV 85.8 87.6  PLT 278 264   Cardiac Enzymes: No results for input(s): CKTOTAL, CKMB, CKMBINDEX, TROPONINI in the last 168  hours. BNP (last 3 results) No results for input(s): PROBNP in the last 8760 hours. CBG:  Recent Labs Lab 09/04/17 0738  GLUCAP 99   D-Dimer: No results for input(s): DDIMER in the last 72 hours. Hgb A1c: No results for input(s): HGBA1C in the last 72 hours. Lipid Profile: No results for input(s): CHOL, HDL, LDLCALC, TRIG, CHOLHDL, LDLDIRECT in the last 72 hours. Thyroid function studies:  Recent Labs  09/04/17 0223  TSH 1.264   Anemia work up: No results for input(s): VITAMINB12, FOLATE, FERRITIN, TIBC, IRON, RETICCTPCT in the last 72 hours. Sepsis Labs:  Recent Labs Lab 09/03/17 2108 09/04/17 0223  WBC 5.6 6.4   Microbiology No results found for this or any previous visit (from the past 240 hour(s)).   Medications:   . enoxaparin (LOVENOX) injection  40 mg Subcutaneous Q24H  . folic acid  1 mg Oral Daily  . LORazepam  0-4 mg Intravenous Q6H   Followed by  . [START ON 09/06/2017] LORazepam  0-4 mg Intravenous Q12H  . multivitamin with minerals  1 tablet Oral Daily  . nicotine  21 mg Transdermal Daily  . pantoprazole (PROTONIX) IV  40 mg Intravenous Q12H  . thiamine  100 mg Oral Daily   Or  . thiamine  100 mg Intravenous Daily   Continuous Infusions: . dextrose 100 mL/hr at 09/04/17 0636      LOS: 0 days   Marinda Elk  [facility] Pager [phone]  *Please refer to [email or web address], password [email or web address] to get updated schedule on who will round on this patient, as hospitalists switch teams weekly. If 7PM-7AM, please contact night-coverage at [email or web address], password [email or web address] for any overnight needs.  09/04/2017, 10:07 AM

## 2017-09-05 ENCOUNTER — Inpatient Hospital Stay (HOSPITAL_COMMUNITY): Payer: Medicare Other

## 2017-09-05 LAB — BASIC METABOLIC PANEL
ANION GAP: 9 (ref 5–15)
BUN: 6 mg/dL (ref 6–20)
CO2: 24 mmol/L (ref 22–32)
Calcium: 8.3 mg/dL — ABNORMAL LOW (ref 8.9–10.3)
Chloride: 98 mmol/L — ABNORMAL LOW (ref 101–111)
Creatinine, Ser: 0.64 mg/dL (ref 0.61–1.24)
GLUCOSE: 136 mg/dL — AB (ref 65–99)
POTASSIUM: 3.6 mmol/L (ref 3.5–5.1)
Sodium: 131 mmol/L — ABNORMAL LOW (ref 135–145)

## 2017-09-05 LAB — URINALYSIS, ROUTINE W REFLEX MICROSCOPIC
Bilirubin Urine: NEGATIVE
Glucose, UA: NEGATIVE mg/dL
Hgb urine dipstick: NEGATIVE
Ketones, ur: NEGATIVE mg/dL
LEUKOCYTES UA: NEGATIVE
NITRITE: NEGATIVE
PH: 7 (ref 5.0–8.0)
Protein, ur: NEGATIVE mg/dL
SPECIFIC GRAVITY, URINE: 1.008 (ref 1.005–1.030)

## 2017-09-05 LAB — MRSA PCR SCREENING: MRSA by PCR: NEGATIVE

## 2017-09-05 LAB — GLUCOSE, CAPILLARY: Glucose-Capillary: 135 mg/dL — ABNORMAL HIGH (ref 65–99)

## 2017-09-05 MED ORDER — LORAZEPAM 2 MG/ML IJ SOLN
2.0000 mg | INTRAMUSCULAR | Status: DC | PRN
  Administered 2017-09-05 – 2017-09-07 (×3): 2 mg via INTRAVENOUS
  Filled 2017-09-05 (×3): qty 1

## 2017-09-05 MED ORDER — MAGNESIUM SULFATE 2 GM/50ML IV SOLN
2.0000 g | Freq: Once | INTRAVENOUS | Status: AC
  Administered 2017-09-05: 2 g via INTRAVENOUS
  Filled 2017-09-05: qty 50

## 2017-09-05 MED ORDER — SODIUM CHLORIDE 0.9 % IV BOLUS (SEPSIS)
500.0000 mL | Freq: Once | INTRAVENOUS | Status: AC
  Administered 2017-09-05: 500 mL via INTRAVENOUS

## 2017-09-05 MED ORDER — LORAZEPAM 2 MG/ML IJ SOLN
1.0000 mg | Freq: Once | INTRAMUSCULAR | Status: AC
  Administered 2017-09-05: 1 mg via INTRAVENOUS
  Filled 2017-09-05: qty 1

## 2017-09-05 MED ORDER — SODIUM CHLORIDE 0.9 % IV SOLN
INTRAVENOUS | Status: DC
  Administered 2017-09-05 – 2017-09-08 (×6): via INTRAVENOUS

## 2017-09-05 MED ORDER — DEXMEDETOMIDINE HCL IN NACL 200 MCG/50ML IV SOLN: 0.2000 ug/kg/h | INTRAVENOUS | Status: DC

## 2017-09-05 MED ORDER — NICOTINE 21 MG/24HR TD PT24: 21.0000 mg | MEDICATED_PATCH | Freq: Every day | TRANSDERMAL | Status: DC

## 2017-09-05 MED ORDER — LORAZEPAM 2 MG/ML IJ SOLN
2.0000 mg | Freq: Once | INTRAMUSCULAR | Status: AC
  Administered 2017-09-05: 2 mg via INTRAVENOUS

## 2017-09-05 MED ORDER — SODIUM CHLORIDE 0.9 % IV BOLUS (SEPSIS)
1000.0000 mL | Freq: Once | INTRAVENOUS | Status: AC
  Administered 2017-09-05: 1000 mL via INTRAVENOUS

## 2017-09-05 MED ORDER — HYDRALAZINE HCL 20 MG/ML IJ SOLN
10.0000 mg | Freq: Four times a day (QID) | INTRAMUSCULAR | Status: DC | PRN
  Administered 2017-09-05 – 2017-09-08 (×2): 10 mg via INTRAVENOUS
  Filled 2017-09-05 (×3): qty 1

## 2017-09-05 NOTE — Progress Notes (Signed)
Report called to [place], Charity fundraiser. Pt being transferred to ICU with severe withdrawal symptoms. Pt disoriented to time and place. Remains in restraints. Transported to ICU via bed with sitter.

## 2017-09-05 NOTE — Progress Notes (Signed)
PROGRESS NOTE  Clayton Alvarez:[id number] DOB: 07/06/1961 DOA: 09/03/2017 PCP: Default, Provider, MD  HPI/Recap of past 24 hours:  Oriented to self only, in restrain, main airway on room air, sitter in room  Assessment/Plan: Principal Problem:   Hyponatremia Active Problems:   Alcohol abuse   Hypokalemia   Nausea & vomiting   Abdominal pain   Tobacco abuse  Alcohol withdrawal:  On ciwa protocol, he get more agitated over the day, ciwa score increase to 25 He is transferred to stepdown unit, ciwa protocol changed to stepdown level. Keep npo for now due to confusion   Fever: tmax 100.9, will get cxr, blood culture, ua No leukocytosis, observe off abx for now  HTN/sinus tachycardia with +Cocaine tsh wnl Ivf, prn hydralazine, treat alcohol withdrawal, avoid betablocker  Hyponatremia:  Sodium 117 on admission from alcohol use and dehydration, on ivf  Hypokalemia/hypomagnesemia: Replace k/mag  Severe malnutrition in context of social or environmental circumstances Nutrition input appreciated  Polysubstance abuse + alcohol, tabacco, cocaine  Code Status: full  Family Communication: patient , mother is not available over the phone  Disposition Plan: transfer to stepdown   Consultants:  none  Procedures:  none  Antibiotics:  none   Objective: BP (!) 169/114 (BP Location: Left Arm) Comment: nurse notified  Pulse (!) 153 Comment: nurse notified  Temp (!) 100.9 F (38.3 C) (Oral) Comment: nurse notified  Resp 20   Ht 6' (1.829 m)   Wt 54.4 kg (119 lb 14.4 oz)   SpO2 96%   BMI 16.26 kg/m   Intake/Output Summary (Last 24 hours) at 09/05/17 0738 Last data filed at 09/05/17 0600  Gross per 24 hour  Intake          1856.67 ml  Output              305 ml  Net          1551.67 ml   Filed Weights   09/04/17 0152  Weight: 54.4 kg (119 lb 14.4 oz)    Exam: Patient is examined daily including today on 09/05/2017, exams remain the same as of  yesterday except that has changed    General:  Confused, agitated  Cardiovascular: tachycardia  Respiratory: CTABL  Abdomen: Soft/ND/NT, positive BS  Musculoskeletal: No Edema  Neuro: confused and agitated  Data Reviewed: Basic Metabolic Panel:  Recent Labs Lab 09/03/17 2108 09/04/17 0223 09/04/17 0918 09/04/17 1438 09/05/17 0615  NA 117* 130* 131* 129* 131*  K 2.9* 3.3* 4.1 4.1 3.6  CL 80* 94* 101 98* 98*  CO2 22 26 20* 21* 24  GLUCOSE 101* 78 86 92 136*  BUN <5* 6  CREATININE 0.78 0.70 0.59* 0.56* 0.64  CALCIUM 8.2* 7.8* 7.8* 7.9* 8.3*  MG  --  1.5*  --   --   --    Liver Function Tests:  Recent Labs Lab 09/03/17 2108  AST 69*  ALT 46  ALKPHOS 160*  BILITOT 1.0  PROT 7.4  ALBUMIN 3.6    Recent Labs Lab 09/03/17 2108  LIPASE 18   No results for input(s): AMMONIA in the last 168 hours. CBC:  Recent Labs Lab 09/03/17 2108 09/04/17 0223  WBC 5.6 6.4  HGB 14.3 13.7  HCT 37.4* 37.3*  MCV 85.8 87.6  PLT 278 264   Cardiac Enzymes:   No results for input(s): CKTOTAL, CKMB, CKMBINDEX, TROPONINI in the last 168 hours. BNP (last 3 results) No results for input(s): BNP in  the last 8760 hours.  ProBNP (last 3 results) No results for input(s): PROBNP in the last 8760 hours.  CBG:  Recent Labs Lab 09/04/17 0738 09/05/17 0658  GLUCAP 99 135*    No results found for this or any previous visit (from the past 240 hour(s)).   Studies: No results found.  Scheduled Meds: . enoxaparin (LOVENOX) injection  40 mg Subcutaneous Q24H  . feeding supplement  1 Container Oral TID BM  . folic acid  1 mg Oral Daily  . LORazepam  0-4 mg Intramuscular Q6H   Followed by  . [START ON 09/06/2017] LORazepam  0-4 mg Intramuscular Q12H  . multivitamin with minerals  1 tablet Oral Daily  . nicotine  21 mg Transdermal Daily  . pantoprazole (PROTONIX) IV  40 mg Intravenous Q12H  . thiamine  100 mg Oral Daily   Or  . thiamine  100 mg Intravenous Daily     Continuous Infusions: . sodium chloride    . dextrose 100 mL/hr at 09/05/17 0130  . magnesium sulfate 1 - 4 g bolus IVPB    . sodium chloride       Time spent: I have personally reviewed and interpreted on  09/05/2017 daily labs, tele strips, imagings as discussed above under data review session and assessment and plans.  I reviewed all nursing notes,  vitals, pertinent old records  I have discussed plan of care as described above with RN , patient  on 09/05/2017   Jadelin Eng MD, PhD  [facility] Pager [phone]. If 7PM-7AM, please contact night-coverage at [email or web address], password [facility] 09/05/2017, 7:38 AM  LOS: 1 day

## 2017-09-05 NOTE — Progress Notes (Signed)
Pt progressively more agitated and confused tonight, with elevated HR and BP r/t ETOH detox.  Also trying to get oob constantly and pulling out IV.  MD on call notified several times for more ativan, a safety sitter, and restraints.  Pt still agitated, more ativan ordered.  Will continue to monitor.

## 2017-09-06 LAB — BASIC METABOLIC PANEL
Anion gap: 9 (ref 5–15)
BUN: 6 mg/dL (ref 6–20)
CHLORIDE: 100 mmol/L — AB (ref 101–111)
CO2: 22 mmol/L (ref 22–32)
CREATININE: 0.65 mg/dL (ref 0.61–1.24)
Calcium: 7.8 mg/dL — ABNORMAL LOW (ref 8.9–10.3)
GFR calc Af Amer: >60 mL/min (ref >60)
GFR calc non Af Amer: >60 mL/min (ref >60)
GLUCOSE: 72 mg/dL (ref 65–99)
POTASSIUM: 3.1 mmol/L — AB (ref 3.5–5.1)
Sodium: 131 mmol/L — ABNORMAL LOW (ref 135–145)

## 2017-09-06 LAB — HEPATIC FUNCTION PANEL
ALT: 27 U/L (ref 17–63)
AST: 41 U/L (ref 15–41)
Albumin: 2.8 g/dL — ABNORMAL LOW (ref 3.5–5.0)
Alkaline Phosphatase: 96 U/L (ref 38–126)
BILIRUBIN DIRECT: 0.5 mg/dL (ref 0.1–0.5)
BILIRUBIN INDIRECT: 1.1 mg/dL — AB (ref 0.3–0.9)
Total Bilirubin: 1.6 mg/dL — ABNORMAL HIGH (ref 0.3–1.2)
Total Protein: 6.2 g/dL — ABNORMAL LOW (ref 6.5–8.1)

## 2017-09-06 LAB — PROCALCITONIN: PROCALCITONIN: 4.95 ng/mL

## 2017-09-06 LAB — MAGNESIUM: MAGNESIUM: 1.7 mg/dL (ref 1.7–2.4)

## 2017-09-06 LAB — GLUCOSE, CAPILLARY: GLUCOSE-CAPILLARY: 71 mg/dL (ref 65–99)

## 2017-09-06 LAB — LACTIC ACID, PLASMA: LACTIC ACID, VENOUS: 2.2 mmol/L — AB (ref 0.5–1.9)

## 2017-09-06 LAB — LIPASE, BLOOD: LIPASE: 15 U/L (ref 11–51)

## 2017-09-06 MED ORDER — SODIUM CHLORIDE 0.9 % IV BOLUS (SEPSIS)
500.0000 mL | Freq: Once | INTRAVENOUS | Status: AC
  Administered 2017-09-06: 500 mL via INTRAVENOUS

## 2017-09-06 MED ORDER — ORAL CARE MOUTH RINSE
15.0000 mL | Freq: Two times a day (BID) | OROMUCOSAL | Status: DC
  Administered 2017-09-06 – 2017-09-08 (×4): 15 mL via OROMUCOSAL

## 2017-09-06 MED ORDER — SODIUM CHLORIDE 0.9 % IV BOLUS (SEPSIS)
1000.0000 mL | Freq: Once | INTRAVENOUS | Status: AC
  Administered 2017-09-06: 1000 mL via INTRAVENOUS

## 2017-09-06 MED ORDER — SODIUM CHLORIDE 0.9 % IV SOLN
1.5000 g | Freq: Three times a day (TID) | INTRAVENOUS | Status: DC
  Administered 2017-09-06 – 2017-09-08 (×7): 1.5 g via INTRAVENOUS
  Filled 2017-09-06 (×8): qty 1.5

## 2017-09-06 MED ORDER — ACETAMINOPHEN 325 MG PO TABS
650.0000 mg | ORAL_TABLET | ORAL | Status: DC | PRN
  Administered 2017-09-06: 650 mg via ORAL
  Filled 2017-09-06: qty 2

## 2017-09-06 MED ORDER — CLONIDINE HCL 0.1 MG PO TABS
0.1000 mg | ORAL_TABLET | Freq: Four times a day (QID) | ORAL | Status: AC
  Administered 2017-09-06 – 2017-09-08 (×8): 0.1 mg via ORAL
  Filled 2017-09-06 (×8): qty 1

## 2017-09-06 MED ORDER — POTASSIUM CHLORIDE 10 MEQ/100ML IV SOLN
10.0000 meq | INTRAVENOUS | Status: AC
  Administered 2017-09-06 (×4): 10 meq via INTRAVENOUS
  Filled 2017-09-06 (×4): qty 100

## 2017-09-06 NOTE — Progress Notes (Signed)
PROGRESS NOTE  Clayton Alvarez NGE:[id number] DOB: Mar 02, 1961 DOA: 09/03/2017 PCP: Default, Provider, MD  HPI/Recap of past 24 hours:  Seems less agitated, more interactive, know he is in the hospital, it is 2018, but states it is march He spiked fever of 101.3,  He remains tachycardic and hypertensive   Assessment/Plan: Principal Problem:   Hyponatremia Active Problems:   Alcohol abuse   Hypokalemia   Nausea & vomiting   Abdominal pain   Tobacco abuse  Alcohol withdrawal:  On ciwa protocol, he get more agitated over the day, ciwa score increase to 25 He is transferred to stepdown unit on 9/19, ciwa protocol changed to stepdown level. Seems less agitated, start clears, continue aspiration precaution   Fever: tmax 101.3, cxr ? Right lower lobe infiltrate? No hypoxia, no cough, no leukocytosis, will start unasyn for now  blood culture in process, ua unremarkable   HTN/sinus tachycardia with +Cocaine tsh wnl Ivf, prn hydralazine, treat alcohol withdrawal, avoid betablocker Remain tachycardia, and hypertensive, will try clonidine for now, anticipate quick taper of clonidine   Hyponatremia:  Sodium 117 on admission from alcohol use and dehydration, Sodium improving , continue ivf  Hypokalemia/hypomagnesemia: Continue to Replace k/mag  Hiccups: kub unremarkable, prn thorazine  Severe malnutrition in context of social or environmental circumstances Nutrition input appreciated  Polysubstance abuse + alcohol, tabacco, cocaine  Code Status: full  Family Communication: patient , mother is not available over the phone (patient report mother recently had hip surgery)  Disposition Plan: remain in stepdown   Consultants:  none  Procedures:  none  Antibiotics:  unasyn from 9/20   Objective: BP (!) 164/105   Pulse (!) 109   Temp (!) 100.4 F (38 C) (Oral)   Resp (!) 23   Ht 6' (1.829 m)   Wt 54.4 kg (119 lb 14.4 oz)   SpO2 100%   BMI 16.26 kg/m    Intake/Output Summary (Last 24 hours) at 09/06/17 0845 Last data filed at 09/06/17 0640  Gross per 24 hour  Intake          1131.67 ml  Output             1675 ml  Net          -543.33 ml   Filed Weights   09/04/17 0152  Weight: 54.4 kg (119 lb 14.4 oz)    Exam: Patient is examined daily including today on 09/06/2017, exams remain the same as of yesterday except that has changed    General:  Less Confused, less agitated  Cardiovascular: remain tachycardia  Respiratory: CTABL  Abdomen: Soft/ND/NT, positive BS  Musculoskeletal: No Edema  Neuro: less confused and less agitated  Data Reviewed: Basic Metabolic Panel:  Recent Labs Lab 09/04/17 0223 09/04/17 0918 09/04/17 1438 09/05/17 0615 09/06/17 0339  NA 130* 131* 129* 131* 131*  K 3.3* 4.1 4.1 3.6 3.1*  CL 94* 101 98* 98* 100*  CO2 26 20* 21* 24 22  GLUCOSE 78 86 92 136* 72  BUN 6 6 <5* 6 6  CREATININE 0.70 0.59* 0.56* 0.64 0.65  CALCIUM 7.8* 7.8* 7.9* 8.3* 7.8*  MG 1.5*  --   --   --  1.7   Liver Function Tests:  Recent Labs Lab 09/03/17 2108 09/06/17 0339  AST 69* 41  ALT 46 27  ALKPHOS 160* 96  BILITOT 1.0 1.6*  PROT 7.4 6.2*  ALBUMIN 3.6 2.8*    Recent Labs Lab 09/03/17 2108 09/06/17 0339  LIPASE 18  15   No results for input(s): AMMONIA in the last 168 hours. CBC:  Recent Labs Lab 09/03/17 2108 09/04/17 0223  WBC 5.6 6.4  HGB 14.3 13.7  HCT 37.4* 37.3*  MCV 85.8 87.6  PLT 278 264   Cardiac Enzymes:   No results for input(s): CKTOTAL, CKMB, CKMBINDEX, TROPONINI in the last 168 hours. BNP (last 3 results) No results for input(s): BNP in the last 8760 hours.  ProBNP (last 3 results) No results for input(s): PROBNP in the last 8760 hours.  CBG:  Recent Labs Lab 09/04/17 0738 09/05/17 0658 09/06/17 0818  GLUCAP 99 135* 71    Recent Results (from the past 240 hour(s))  MRSA PCR Screening     Status: None   Collection Time: 09/05/17  9:29 PM  Result Value Ref Range  Status   MRSA by PCR NEGATIVE NEGATIVE Final    Comment:        The GeneXpert MRSA Assay (FDA approved for NASAL specimens only), is one component of a comprehensive MRSA colonization surveillance program. It is not intended to diagnose MRSA infection nor to guide or monitor treatment for MRSA infections.      Studies: No results found.  Scheduled Meds: . cloNIDine  0.1 mg Oral Q6H  . enoxaparin (LOVENOX) injection  40 mg Subcutaneous Q24H  . feeding supplement  1 Container Oral TID BM  . folic acid  1 mg Oral Daily  . multivitamin with minerals  1 tablet Oral Daily  . nicotine  21 mg Transdermal Daily  . pantoprazole (PROTONIX) IV  40 mg Intravenous Q12H  . thiamine  100 mg Oral Daily   Or  . thiamine  100 mg Intravenous Daily    Continuous Infusions: . sodium chloride 75 mL/hr at 09/06/17 0600  . ampicillin-sulbactam (UNASYN) IV    . potassium chloride       Time spent: I have personally reviewed and interpreted on  09/06/2017 daily labs, tele strips, imagings as discussed above under data review session and assessment and plans.  I reviewed all nursing notes,  vitals, pertinent old records  I have discussed plan of care as described above with RN , patient  on 09/06/2017   Woodson Macha MD, PhD  [facility] Pager [phone]. If 7PM-7AM, please contact night-coverage at [email or web address], password [facility] 09/06/2017, 8:45 AM  LOS: 2 days

## 2017-09-06 NOTE — Progress Notes (Signed)
CRITICAL VALUE ALERT  Critical Value:  Lactic acid 2.2  Date & Time Notied:  09-06-17 1530  Provider Notified: Roda Shutters  Orders Received/Actions taken: MD notified. Fluid bolus ordered. Will continue to monitor

## 2017-09-07 ENCOUNTER — Inpatient Hospital Stay (HOSPITAL_COMMUNITY): Payer: Medicare Other

## 2017-09-07 LAB — CBC WITH DIFFERENTIAL/PLATELET
BASOS ABS: 0 10*3/uL (ref 0.0–0.1)
BASOS PCT: 0 %
EOS ABS: 0 10*3/uL (ref 0.0–0.7)
Eosinophils Relative: 0 %
HCT: 28.3 % — ABNORMAL LOW (ref 39.0–52.0)
Hemoglobin: 10 g/dL — ABNORMAL LOW (ref 13.0–17.0)
Lymphocytes Relative: 13 %
Lymphs Abs: 1.2 10*3/uL (ref 0.7–4.0)
MCH: 31.8 pg (ref 26.0–34.0)
MCHC: 35.3 g/dL (ref 30.0–36.0)
MCV: 90.1 fL (ref 78.0–100.0)
MONO ABS: 1.3 10*3/uL — AB (ref 0.1–1.0)
MONOS PCT: 13 %
NEUTROS PCT: 74 %
Neutro Abs: 7.4 10*3/uL (ref 1.7–7.7)
Platelets: 148 10*3/uL — ABNORMAL LOW (ref 150–400)
RBC: 3.14 MIL/uL — ABNORMAL LOW (ref 4.22–5.81)
RDW: 13.5 % (ref 11.5–15.5)
WBC: 10 10*3/uL (ref 4.0–10.5)

## 2017-09-07 LAB — MAGNESIUM: MAGNESIUM: 1.7 mg/dL (ref 1.7–2.4)

## 2017-09-07 LAB — HEPATIC FUNCTION PANEL
ALBUMIN: 2.4 g/dL — AB (ref 3.5–5.0)
ALT: 22 U/L (ref 17–63)
AST: 32 U/L (ref 15–41)
Alkaline Phosphatase: 80 U/L (ref 38–126)
Bilirubin, Direct: 0.3 mg/dL (ref 0.1–0.5)
Indirect Bilirubin: 0.8 mg/dL (ref 0.3–0.9)
TOTAL PROTEIN: 5.7 g/dL — AB (ref 6.5–8.1)
Total Bilirubin: 1.1 mg/dL (ref 0.3–1.2)

## 2017-09-07 LAB — BASIC METABOLIC PANEL
Anion gap: 7 (ref 5–15)
BUN: 6 mg/dL (ref 6–20)
CALCIUM: 7.5 mg/dL — AB (ref 8.9–10.3)
CO2: 21 mmol/L — AB (ref 22–32)
CREATININE: 0.57 mg/dL — AB (ref 0.61–1.24)
Chloride: 106 mmol/L (ref 101–111)
GLUCOSE: 99 mg/dL (ref 65–99)
Potassium: 3.1 mmol/L — ABNORMAL LOW (ref 3.5–5.1)
Sodium: 134 mmol/L — ABNORMAL LOW (ref 135–145)

## 2017-09-07 LAB — GLUCOSE, CAPILLARY: Glucose-Capillary: 111 mg/dL — ABNORMAL HIGH (ref 65–99)

## 2017-09-07 LAB — LACTIC ACID, PLASMA: LACTIC ACID, VENOUS: 0.7 mmol/L (ref 0.5–1.9)

## 2017-09-07 MED ORDER — POTASSIUM CHLORIDE CRYS ER 20 MEQ PO TBCR
40.0000 meq | EXTENDED_RELEASE_TABLET | ORAL | Status: AC
  Administered 2017-09-07 (×2): 40 meq via ORAL
  Filled 2017-09-07 (×2): qty 2

## 2017-09-07 MED ORDER — LIP MEDEX EX OINT
TOPICAL_OINTMENT | CUTANEOUS | Status: AC
  Administered 2017-09-07: 20:00:00
  Filled 2017-09-07: qty 7

## 2017-09-07 MED ORDER — MAGNESIUM SULFATE 2 GM/50ML IV SOLN
2.0000 g | Freq: Once | INTRAVENOUS | Status: AC
  Administered 2017-09-07: 2 g via INTRAVENOUS
  Filled 2017-09-07: qty 50

## 2017-09-07 MED ORDER — CLONIDINE HCL 0.1 MG PO TABS
0.1000 mg | ORAL_TABLET | Freq: Two times a day (BID) | ORAL | Status: DC
  Administered 2017-09-08: 0.1 mg via ORAL
  Filled 2017-09-07: qty 1

## 2017-09-07 MED ORDER — PANTOPRAZOLE SODIUM 40 MG PO TBEC
40.0000 mg | DELAYED_RELEASE_TABLET | Freq: Two times a day (BID) | ORAL | Status: DC
  Administered 2017-09-07 – 2017-09-08 (×2): 40 mg via ORAL
  Filled 2017-09-07 (×2): qty 1

## 2017-09-07 NOTE — Evaluation (Signed)
Clinical/Bedside Swallow Evaluation Patient Details  Name: Clayton Alvarez MRN: [id number] Date of Birth: 1961/01/05  Today's Date: 09/07/2017 Time: SLP Start Time (ACUTE ONLY): 0815 SLP Stop Time (ACUTE ONLY): 0823 SLP Time Calculation (min) (ACUTE ONLY): 8 min  Past Medical History:  Past Medical History:  Diagnosis Date  . Alcohol abuse    Past Surgical History:  Past Surgical History:  Procedure Laterality Date  . FACIAL LACERATION REPAIR Left    HPI:  Clayton Alvarez a 56 y.o.malewith medical history significant of alcohol abuse, tobacco abuse, who presents with nausea, vomiting abdominal pain.  Dx with hyponatremia, hypokalemia, on CIWA protocol.    Assessment / Plan / Recommendation Clinical Impression  Pt presents with signs of oropharyngeal swallow outside of normal limits. Subjective observation at bedside includes multiple swallows per sip of liquid or bite of puree, up to 6 or 7 with one cough noted with initial sip. Suspect pt generally protects his airway during swallow, but presentation is concerning for possible residual or primary esophageal dysphagia impacting the pharynx. Will proceed with objective swallow assessment MBS to further evaluate function. Pt in agreement but reports his primary concern is missing dentition, wishes he could get some dentures.  SLP Visit Diagnosis: Dysphagia, oropharyngeal phase (R13.12)    Aspiration Risk  Mild aspiration risk    Diet Recommendation Thin liquid   Medication Administration: Whole meds with liquid Supervision: Patient able to self feed    Other  Recommendations Oral Care Recommendations: Oral care QID   Follow up Recommendations        Frequency and Duration            Prognosis        Swallow Study   General HPI: Clayton Alvarez a 56 y.o.malewith medical history significant of alcohol abuse, tobacco abuse, who presents with nausea, vomiting abdominal pain.  Dx with hyponatremia,  hypokalemia, on CIWA protocol.  Type of Study: Bedside Swallow Evaluation Previous Swallow Assessment: none Diet Prior to this Study: Thin liquids Temperature Spikes Noted: No Respiratory Status: Room air History of Recent Intubation: No Behavior/Cognition: Alert;Cooperative Oral Cavity Assessment: Within Functional Limits Oral Care Completed by SLP: No Oral Cavity - Dentition: Poor condition;Missing dentition Vision: Functional for self-feeding Self-Feeding Abilities: Needs assist Patient Positioning: Upright in bed Baseline Vocal Quality: Low vocal intensity;Hoarse Volitional Cough: Weak Volitional Swallow: Able to elicit    Oral/Motor/Sensory Function     Ice Chips     Thin Liquid Thin Liquid: Impaired Presentation: Cup;Straw Pharyngeal  Phase Impairments: Multiple swallows;Cough - Immediate    Nectar Thick Nectar Thick Liquid: Not tested   Honey Thick Honey Thick Liquid: Not tested   Puree Puree: Impaired Presentation: Spoon Pharyngeal Phase Impairments: Multiple swallows   Solid   GO   Solid: Within functional limits       Harlon Ditty, MA CCC-SLP [phone]  Jillane Po, Riley Nearing 09/07/2017,8:32 AM

## 2017-09-07 NOTE — Progress Notes (Signed)
PHARMACIST - PHYSICIAN COMMUNICATION CONCERNING: IV to Oral Route Change Policy  RECOMMENDATION: This patient is receiving Protonix, Thiamine by the intravenous route.  Based on criteria approved by the Pharmacy and Therapeutics Committee, the intravenous medication(s) is/are being converted to the equivalent oral dose form(s).   DESCRIPTION: These criteria include:  The patient is eating (either orally or via tube) and/or has been taking other orally administered medications for a least 24 hours  The patient has no evidence of active gastrointestinal bleeding or impaired GI absorption (gastrectomy, short bowel, patient on TNA or NPO).  If you have questions about this conversion, please contact the Pharmacy Department    [phone] )  [facility]   [phone] )  [facility]   [phone] )  [facility]   [phone] )  [facility]   [phone] )  [facility]   Clance Boll, [facility] 09/07/2017 1:45 PM

## 2017-09-07 NOTE — Progress Notes (Signed)
Modified Barium Swallow Progress Note  Patient Details  Name: Clayton Alvarez MRN: [id number] Date of Birth: [birth year]  Today's Date: 09/07/2017  Modified Barium Swallow completed.  Full report located under Chart Review in the Imaging Section.  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 Appearance of slight bony protrusion at C3/4 impedes complete epiglottic deflection resulting in trace flash/coating frank penetration with sensation, mild sensed vallecular residuals and multiple swallows to clear. Pt is protecting his airway adequately and tolerates soft soldis well given missing dentition. Recommend dys 3 mech soft diet and thin liquids. No SLP f/u needed.    Swallow Evaluation Recommendations       SLP Diet Recommendations: Dysphagia 3 (Mech soft) solids;Thin liquid   Liquid Administration via: Cup;Straw   Medication Administration: Whole meds with liquid   Supervision: Patient able to self feed   Compensations: Slow rate;Small sips/bites;Clear throat intermittently   Postural Changes: Remain semi-upright after after feeds/meals (Comment)   Oral Care Recommendations: Oral care BID       Harlon Ditty, MA CCC-SLP [phone]  Jamie-Lee Galdamez, Riley Nearing 09/07/2017,1:37 PM

## 2017-09-07 NOTE — Progress Notes (Signed)
PROGRESS NOTE  Clayton Alvarez ZOX:[id number] DOB: 11/22/1961 DOA: 09/03/2017 PCP: Default, Provider, MD  HPI/Recap of past 24 hours:  Last fever 3pm on 9/20, blood pressure and heart rate is improving, hiccups has improved. More alert , interactive, friends at bedside   Assessment/Plan: Principal Problem:   Hyponatremia Active Problems:   Alcohol abuse   Hypokalemia   Nausea & vomiting   Abdominal pain   Tobacco abuse  Alcohol withdrawal:  -Initially admitted to tele floor , he is later transferred to stepdown on 9/19 due to increased agitation and confusion with ciwa score increased to 25  -improving, transfer to med tele on 9/21  Fever: tmax 101.3, cxr ? Right lower lobe infiltrate? No hypoxia, no cough, no leukocytosis. He is started on unasyn  Since 9/20  blood culture no growth, ua unremarkable Soft diet/thin liquid per Swallow eval /with modified barium swallow    HTN/sinus tachycardia with +Cocaine tsh wnl treat alcohol withdrawal, avoid betablocker Due to persistent tachycardia/ hypertensive, he is started on clonidine from 9/20, improving, anticipate quick taper of clonidine   Hyponatremia:  Sodium 117 on admission from alcohol use and dehydration, Sodium improving , 134 today, continue ivf for another 24hrs  Hypokalemia/hypomagnesemia: k .31, mag 1.7 today Continue to Replace k/mag  Hiccups: kub unremarkable, prn thorazine  Severe malnutrition in context of social or environmental circumstances Nutrition input appreciated  Polysubstance abuse + alcohol, tabacco, cocaine  Code Status: full  Family Communication: patient , mother is not available over the phone (patient report mother recently had hip surgery), friends at bedside,   Disposition Plan: transfer to med tele    Diet: SLP Diet Recommendations: Dysphagia 3 (Mech soft) solids;Thin liquid   Liquid Administration via: Cup;Straw   Medication Administration: Whole meds with  liquid   Supervision: Patient able to self feed   Compensations: Slow rate;Small sips/bites;Clear throat intermittently   Postural Changes: Remain semi-upright after after feeds/meals (Comment)  Consultants:  none  Procedures:  none  Antibiotics:  unasyn from 9/20   Objective: BP 120/81   Pulse 86   Temp 98.1 F (36.7 C) (Axillary)   Resp 18   Ht 6' (1.829 m)   Wt 54.4 kg (119 lb 14.4 oz)   SpO2 100%   BMI 16.26 kg/m   Intake/Output Summary (Last 24 hours) at 09/07/17 0753 Last data filed at 09/07/17 0600  Gross per 24 hour  Intake             2630 ml  Output             1725 ml  Net              905 ml   Filed Weights   09/04/17 0152  Weight: 54.4 kg (119 lb 14.4 oz)    Exam: Patient is examined daily including today on 09/07/2017, exams remain the same as of yesterday except that has changed    General:  Awake, alert, calm and interactive  Cardiovascular: tachycardia has resolved, now NSR  Respiratory: CTABL  Abdomen: Soft/ND/NT, positive BS  Musculoskeletal: No Edema  Neuro:  Awake, alert, calm and interactive  Data Reviewed: Basic Metabolic Panel:  Recent Labs Lab 09/04/17 0223 09/04/17 0918 09/04/17 1438 09/05/17 0615 09/06/17 0339 09/07/17 0318  NA 130* 131* 129* 131* 131* 134*  K 3.3* 4.1 4.1 3.6 3.1* 3.1*  CL 94* 101 98* 98* 100* 106  CO2 26 20* 21* 24 22 21*  GLUCOSE 78 86 92 136* 72  99  BUN 6 6 <5* CREATININE 0.70 0.59* 0.56* 0.64 0.65 0.57*  CALCIUM 7.8* 7.8* 7.9* 8.3* 7.8* 7.5*  MG 1.5*  --   --   --  1.7 1.7   Liver Function Tests:  Recent Labs Lab 09/03/17 2108 09/06/17 0339 09/07/17 0318  AST 69* 41 32  ALT 46 27 22  ALKPHOS 160* 96 80  BILITOT 1.0 1.6* 1.1  PROT 7.4 6.2* 5.7*  ALBUMIN 3.6 2.8* 2.4*    Recent Labs Lab 09/03/17 2108 09/06/17 0339  LIPASE 18 15   No results for input(s): AMMONIA in the last 168 hours. CBC:  Recent Labs Lab 09/03/17 2108 09/04/17 0223 09/07/17 0318   WBC 5.6 6.4 10.0  NEUTROABS  --   --  7.4  HGB 14.3 13.7 10.0*  HCT 37.4* 37.3* 28.3*  MCV 85.8 87.6 90.1  PLT 278 264 148*   Cardiac Enzymes:   No results for input(s): CKTOTAL, CKMB, CKMBINDEX, TROPONINI in the last 168 hours. BNP (last 3 results) No results for input(s): BNP in the last 8760 hours.  ProBNP (last 3 results) No results for input(s): PROBNP in the last 8760 hours.  CBG:  Recent Labs Lab 09/04/17 0738 09/05/17 0658 09/06/17 0818 09/07/17 0746  GLUCAP 99 135* 71 111*    Recent Results (from the past 240 hour(s))  Culture, blood (routine x 2)     Status: None (Preliminary result)   Collection Time: 09/05/17  9:28 AM  Result Value Ref Range Status   Specimen Description LEFT ANTECUBITAL  Final   Special Requests IN PEDIATRIC BOTTLE Blood Culture adequate volume  Final   Culture   Final    NO GROWTH 1 DAY Performed at [facility], [address], [place], [place] [address]    Report Status PENDING  Incomplete  Culture, blood (routine x 2)     Status: None (Preliminary result)   Collection Time: 09/05/17  9:34 AM  Result Value Ref Range Status   Specimen Description BLOOD LEFT HAND  Final   Special Requests IN PEDIATRIC BOTTLE Blood Culture adequate volume  Final   Culture   Final    NO GROWTH 1 DAY Performed at [facility], [address], [place], [place] [address]    Report Status PENDING  Incomplete  MRSA PCR Screening     Status: None   Collection Time: 09/05/17  9:29 PM  Result Value Ref Range Status   MRSA by PCR NEGATIVE NEGATIVE Final    Comment:        The GeneXpert MRSA Assay (FDA approved for NASAL specimens only), is one component of a comprehensive MRSA colonization surveillance program. It is not intended to diagnose MRSA infection nor to guide or monitor treatment for MRSA infections.      Studies: No results found.  Scheduled Meds: . cloNIDine  0.1 mg Oral Q6H  . enoxaparin (LOVENOX) injection  40 mg  Subcutaneous Q24H  . feeding supplement  1 Container Oral TID BM  . folic acid  1 mg Oral Daily  . mouth rinse  15 mL Mouth Rinse BID  . multivitamin with minerals  1 tablet Oral Daily  . nicotine  21 mg Transdermal Daily  . pantoprazole (PROTONIX) IV  40 mg Intravenous Q12H  . thiamine  100 mg Oral Daily   Or  . thiamine  100 mg Intravenous Daily    Continuous Infusions: . sodium chloride 75 mL/hr at 09/07/17 0328  .  ampicillin-sulbactam (UNASYN) IV Stopped (09/07/17 0251)  . magnesium sulfate 1 - 4 g bolus IVPB       Time spent: I have personally reviewed and interpreted on  09/07/2017 daily labs, tele strips, imagings as discussed above under data review session and assessment and plans.  I reviewed all nursing notes,  vitals, pertinent old records  I have discussed plan of care as described above with RN , patient  on 09/07/2017   Anastyn Ayars MD, PhD  [facility] Pager [phone]. If 7PM-7AM, please contact night-coverage at [email or web address], password [facility] 09/07/2017, 7:53 AM  LOS: 3 days

## 2017-09-08 LAB — PROCALCITONIN: PROCALCITONIN: 1.9 ng/mL

## 2017-09-08 LAB — CBC WITH DIFFERENTIAL/PLATELET
BASOS ABS: 0 10*3/uL (ref 0.0–0.1)
Basophils Relative: 0 %
EOS ABS: 0.1 10*3/uL (ref 0.0–0.7)
EOS PCT: 1 %
HCT: 30.9 % — ABNORMAL LOW (ref 39.0–52.0)
Hemoglobin: 11 g/dL — ABNORMAL LOW (ref 13.0–17.0)
Lymphocytes Relative: 19 %
Lymphs Abs: 1.4 10*3/uL (ref 0.7–4.0)
MCH: 32.4 pg (ref 26.0–34.0)
MCHC: 35.6 g/dL (ref 30.0–36.0)
MCV: 90.9 fL (ref 78.0–100.0)
MONO ABS: 1.4 10*3/uL — AB (ref 0.1–1.0)
Monocytes Relative: 18 %
Neutro Abs: 4.6 10*3/uL (ref 1.7–7.7)
Neutrophils Relative %: 62 %
PLATELETS: 197 10*3/uL (ref 150–400)
RBC: 3.4 MIL/uL — ABNORMAL LOW (ref 4.22–5.81)
RDW: 13.4 % (ref 11.5–15.5)
WBC: 7.4 10*3/uL (ref 4.0–10.5)

## 2017-09-08 LAB — GLUCOSE, CAPILLARY: GLUCOSE-CAPILLARY: 114 mg/dL — AB (ref 65–99)

## 2017-09-08 LAB — MAGNESIUM: MAGNESIUM: 1.4 mg/dL — AB (ref 1.7–2.4)

## 2017-09-08 LAB — BASIC METABOLIC PANEL
Anion gap: 8 (ref 5–15)
BUN: 5 mg/dL — AB (ref 6–20)
CHLORIDE: 100 mmol/L — AB (ref 101–111)
CO2: 22 mmol/L (ref 22–32)
CREATININE: 0.57 mg/dL — AB (ref 0.61–1.24)
Calcium: 8.1 mg/dL — ABNORMAL LOW (ref 8.9–10.3)
GFR calc Af Amer: >60 mL/min (ref >60)
Glucose, Bld: 102 mg/dL — ABNORMAL HIGH (ref 65–99)
Potassium: 3.8 mmol/L (ref 3.5–5.1)
SODIUM: 130 mmol/L — AB (ref 135–145)

## 2017-09-08 MED ORDER — AMOXICILLIN-POT CLAVULANATE 875-125 MG PO TABS: 1.0000 | ORAL_TABLET | Freq: Two times a day (BID) | ORAL | 0 refills | Status: AC

## 2017-09-08 MED ORDER — MAGNESIUM SULFATE 2 GM/50ML IV SOLN
2.0000 g | Freq: Once | INTRAVENOUS | Status: AC
  Administered 2017-09-08: 2 g via INTRAVENOUS
  Filled 2017-09-08: qty 50

## 2017-09-08 MED ORDER — FOLIC ACID 1 MG PO TABS: 1.0000 mg | ORAL_TABLET | Freq: Every day | ORAL | 0 refills | Status: AC

## 2017-09-08 MED ORDER — AMLODIPINE BESYLATE 10 MG PO TABS: 10.0000 mg | ORAL_TABLET | Freq: Every day | ORAL | 0 refills | Status: AC

## 2017-09-08 MED ORDER — THIAMINE HCL 100 MG PO TABS: 100.0000 mg | ORAL_TABLET | Freq: Every day | ORAL | 0 refills | Status: AC

## 2017-09-08 MED ORDER — AMLODIPINE BESYLATE 5 MG PO TABS
5.0000 mg | ORAL_TABLET | Freq: Every day | ORAL | Status: DC
  Administered 2017-09-08: 5 mg via ORAL
  Filled 2017-09-08: qty 1

## 2017-09-08 MED ORDER — CHLORPROMAZINE HCL 10 MG PO TABS
10.0000 mg | ORAL_TABLET | Freq: Four times a day (QID) | ORAL | 0 refills | Status: DC | PRN
Start: 2017-09-08 — End: 2017-10-01

## 2017-09-08 MED ORDER — PANTOPRAZOLE SODIUM 20 MG PO TBEC: 20.0000 mg | DELAYED_RELEASE_TABLET | Freq: Every day | ORAL | 0 refills | Status: AC

## 2017-09-08 NOTE — Discharge Summary (Addendum)
Discharge Summary  Clayton Alvarez:[id number] DOB: Aug 17, 1961  PCP: Fleet Contras, MD  Admit date: 09/03/2017 Discharge date: 09/08/2017  Time spent: >1mins, more than 50% time spent on coordination of care.  Recommendations for Outpatient Follow-up:  1. F/u with PMD within a week  for hospital discharge follow up, repeat cbc/bmp at follow up 2. Home health arranged  Discharge Diagnoses:  Active Hospital Problems   Diagnosis Date Noted  . Hyponatremia 09/04/2017  . Hypokalemia 09/04/2017  . Nausea & vomiting 09/04/2017  . Abdominal pain 09/04/2017  . Tobacco abuse 09/04/2017  . Alcohol abuse     Resolved Hospital Problems   Diagnosis Date Noted Date Resolved  No resolved problems to display.    Discharge Condition: stable  Diet recommendation: heart healthy  Filed Weights   09/04/17 0152  Weight: 54.4 kg (119 lb 14.4 oz)    History of present illness:  Patient coming from:  The patient is coming from home.  At baseline, pt is independent for most of ADL.   Chief Complaint: Nausea, vomiting, abdominal pain, Hiccups   HPI: Clayton Alvarez is a 56 y.o. male with medical history significant of alcohol abuse, tobacco abuse, who presents with nausea, vomiting abdominal pain.  Patient states that he has been having nausea, vomiting and abdominal pain since last night. He vomited more than 10 times without blood in the vomitus. His abdominal pain is located in the epigastric area, constant, moderate, nonradiating. Patient states that he had four diarrhea yesterday, which has resolved. He also reports intractable hiccups. Currently no diarrhea. Patient does not have chest pain or shortness of breath. No cough, fever or chills. No symptoms of UTI or unilateral weakness.   ED Course: pt was found to have Potassium 2.9, sodium 117, creatinine normal, WBC 5.6, negative urinalysis, temperature normal, tachycardia, tachypnea, oxygen saturation 100% on room air. Patient is  placed on telemetry bed for observation.   Hospital Course:  Principal Problem:   Hyponatremia Active Problems:   Alcohol abuse   Hypokalemia   Nausea & vomiting   Abdominal pain   Tobacco abuse   Alcohol withdrawal:  -Initially admitted to tele floor , he is later transferred to stepdown on 9/19 due to increased agitation and confusion with ciwa score increased to 25  -improving, transfer to med tele on 9/21 -he is back to baseline on 9/22, aaox3, he walked with physical therapy, he wants to go home. Home health arranged.  Fever:  tmax 101.3 during initial hospitalization, cxr ? Right lower lobe infiltrate?  No hypoxia,  no leukocytosis. He is started on unasyn  Since 9/20 to cover for aspiration pneumonia.   blood culture no growth, ua unremarkable Soft diet/thin liquid per Swallow eval /with modified barium swallow  Fever resolved, he is discharge on augmentin to finish treatment course.   HTN/sinus tachycardia with +Cocaine tsh wnl treat alcohol withdrawal, avoid betablocker Due to persistent tachycardia/ hypertensive, he is started on clonidine from 9/20, improving, quick taper of clonidine He is discharged on norvasc, he is to follow with pmd for blood pressure management.   Hyponatremia:  Sodium 117 on admission from alcohol use and dehydration, Sodium improving with ivf.  he is to follow with pmd to repeat labs.  Hypokalemia/hypomagnesemia: k .31, mag 1.7 today Continue to Replace k/mag  Hiccups: kub unremarkable, prn thorazine, resolved.  Severe malnutrition in context of social or environmental circumstances Nutrition input appreciated  Polysubstance abuse + alcohol, tabacco, cocaine  Right hip pain: x  Ray with severe arthritis, he walks with a cane. PT evel recommended home health.   Code Status: full  Family Communication: patient , mother is not available over the phone (patient report mother recently had hip surgery), friends at  bedside,   Disposition Plan: d/c home with home health    Diet: SLP Diet Recommendations: Dysphagia 3 (Mech soft) solids;Thin liquid  Liquid Administration via: Cup;Straw  Medication Administration: Whole meds with liquid  Supervision: Patient able to self feed  Compensations: Slow rate;Small sips/bites;Clear throat intermittently  Postural Changes: Remain semi-upright after after feeds/meals (Comment)  Consultants:  none  Procedures:  none  Antibiotics:  unasyn from 9/20   Discharge Exam: BP (!) 136/97 (BP Location: Right Arm)   Pulse 100   Temp 100 F (37.8 C) (Oral)   Resp 16   Ht 6' (1.829 m)   Wt 54.4 kg (119 lb 14.4 oz)   SpO2 100%   BMI 16.26 kg/m   General: NAD, AAOX3, poor dentition  Cardiovascular: RRR Respiratory: CTABl  Discharge Instructions You were cared for by a hospitalist during your hospital stay. If you have any questions about your discharge medications or the care you received while you were in the hospital after you are discharged, you can call the unit and asked to speak with the hospitalist on call if the hospitalist that took care of you is not available. Once you are discharged, your primary care physician will handle any further medical issues. Please note that NO REFILLS for any discharge medications will be authorized once you are discharged, as it is imperative that you return to your primary care physician (or establish a relationship with a primary care physician if you do not have one) for your aftercare needs so that they can reassess your need for medications and monitor your lab values.  Discharge Instructions    Diet general    Complete by:  As directed    Face-to-face encounter (required for Medicare/Medicaid patients)    Complete by:  As directed    I Jahnessa Vanduyn certify that this patient is under my care and that I, or a nurse practitioner or physician's assistant working with me, had a face-to-face encounter  that meets the physician face-to-face encounter requirements with this patient on 09/08/2017. The encounter with the patient was in whole, or in part for the following medical condition(s) which is the primary reason for home health care (List medical condition): FTT   The encounter with the patient was in whole, or in part, for the following medical condition, which is the primary reason for home health care:  FTT   I certify that, based on my findings, the following services are medically necessary home health services:   Nursing Physical therapy     Reason for Medically Necessary Home Health Services:  Skilled Nursing- Change/Decline in Patient Status   My clinical findings support the need for the above services:  Pain interferes with ambulation/mobility   Further, I certify that my clinical findings support that this patient is homebound due to:  Pain interferes with ambulation/mobility   Home Health    Complete by:  As directed    To provide the following care/treatments:   PT RN Home Health Aide Social work     Increase activity slowly    Complete by:  As directed      Allergies as of 09/08/2017   No Known Allergies     Medication List    TAKE these medications  acetaminophen 325 MG tablet Commonly known as:  TYLENOL Take 650 mg by mouth every 6 (six) hours as needed. For hip pain   amLODipine 10 MG tablet Commonly known as:  NORVASC Take 1 tablet (10 mg total) by mouth daily.   amoxicillin-clavulanate 875-125 MG tablet Commonly known as:  AUGMENTIN Take 1 tablet by mouth 2 (two) times daily.   chlorproMAZINE 10 MG tablet Commonly known as:  THORAZINE Take 1 tablet (10 mg total) by mouth 4 (four) times daily as needed for hiccoughs.   folic acid 1 MG tablet Commonly known as:  FOLVITE Take 1 tablet (1 mg total) by mouth daily.   pantoprazole 20 MG tablet Commonly known as:  PROTONIX Take 1 tablet (20 mg total) by mouth daily.   polyvinyl alcohol 1.4 %  ophthalmic solution Commonly known as:  LIQUIFILM TEARS Place 1 drop into both eyes as needed for dry eyes.   thiamine 100 MG tablet Take 1 tablet (100 mg total) by mouth daily.            Discharge Care Instructions        Start     Ordered   09/09/17 0000  folic acid (FOLVITE) 1 MG tablet  Daily     09/08/17 1104   09/09/17 0000  thiamine 100 MG tablet  Daily     09/08/17 1104   09/08/17 0000  pantoprazole (PROTONIX) 20 MG tablet  Daily     09/08/17 1104   09/08/17 0000  Increase activity slowly     09/08/17 1104   09/08/17 0000  Diet general     09/08/17 1104   09/08/17 0000  chlorproMAZINE (THORAZINE) 10 MG tablet  4 times daily PRN     09/08/17 1106   09/08/17 0000  amLODipine (NORVASC) 10 MG tablet  Daily     09/08/17 1106   09/08/17 0000  amoxicillin-clavulanate (AUGMENTIN) 875-125 MG tablet  2 times daily     09/08/17 1106   09/08/17 0000  Home Health  (Home health needs / face to face )    Question Answer Comment  To provide the following care/treatments PT   To provide the following care/treatments RN   To provide the following care/treatments Home Health Aide   To provide the following care/treatments Social work      09/08/17 1505   09/08/17 0000  Face-to-face encounter (required for Medicare/Medicaid patients)  (Home health needs / face to face )    Comments:  I Girlie Veltri certify that this patient is under my care and that I, or a nurse practitioner or physician's assistant working with me, had a face-to-face encounter that meets the physician face-to-face encounter requirements with this patient on 09/08/2017. The encounter with the patient was in whole, or in part for the following medical condition(s) which is the primary reason for home health care (List medical condition): FTT  Question Answer Comment  The encounter with the patient was in whole, or in part, for the following medical condition, which is the primary reason for home health care FTT   I  certify that, based on my findings, the following services are medically necessary home health services Nursing   I certify that, based on my findings, the following services are medically necessary home health services Physical therapy   Reason for Medically Necessary Home Health Services Skilled Nursing- Change/Decline in Patient Status   My clinical findings support the need for the above services Pain interferes with ambulation/mobility   Further,  I certify that my clinical findings support that this patient is homebound due to: Pain interferes with ambulation/mobility      09/08/17 1505     No Known Allergies Follow-up Information    Fleet Contras, MD Follow up.   Specialty:  Internal Medicine Why:  please call to arrange appt. please speak to physician to arrange [facility] repeat cbc/bmp at follow up, pmd to continue adjust blood pressure meds. Contact information: [address] [place] [address] [phone]        [employer] Follow up.   Why:  please call to request a provider list Contact information: [phone]       [facility] - Dme Follow up.   Why:  Home Health RN, Physical Therapy, aide, SW- agency will call you to arrange initial visit Contact information: [address] [place] [address] [phone]        [facility], [facility] .   Contact information: [address] [place] [address] [phone]            The results of significant diagnostics from this hospitalization (including imaging, microbiology, ancillary and laboratory) are listed below for reference.    Significant Diagnostic Studies: Dg Abd 1 View  Result Date: 09/05/2017 CLINICAL DATA:  55 year old male with nausea vomiting and confusion. EXAM: ABDOMEN - 1 VIEW COMPARISON:  07/05/2010 and earlier. FINDINGS: Portable AP view at 0800 hours. Mild motion artifact. Non obstructed bowel gas pattern.  Abdominal visceral contours are within normal limits. 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 Bulky right lateral lumbar spine vertebral endplate spurring. No acute osseous abnormality identified. IMPRESSION: 1. Normal bowel gas pattern. 2. 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 Electronically Signed   By: Odessa Fleming M.D.   On: 09/05/2017 09:25   Dg Chest Port 1 View  Result Date: 09/05/2017 CLINICAL DATA:  Confusion.  History of alcohol abuse. EXAM: PORTABLE CHEST 1 VIEW COMPARISON:  03/30/2017. FINDINGS: Normal cardiomediastinal silhouette. Early opacity medial RIGHT base could represent developing infiltrate. No effusion or pneumothorax. No acute osseous findings. IMPRESSION: Cannot exclude early RIGHT lower lobe infiltrate. Continued surveillance warranted. Electronically Signed   By: Elsie Stain M.D.   On: 09/05/2017 09:19    Microbiology: Recent Results (from the past 240 hour(s))  Culture, blood (routine x 2)     Status: None (Preliminary result)   Collection Time: 09/05/17  9:28 AM  Result Value Ref Range Status   Specimen Description LEFT ANTECUBITAL  Final   Special Requests IN PEDIATRIC BOTTLE Blood Culture adequate volume  Final   Culture   Final    NO GROWTH 3 DAYS Performed at [facility], [address], [place], [place] [address]    Report Status PENDING  Incomplete  Culture, blood (routine x 2)     Status: None (Preliminary result)   Collection Time: 09/05/17  9:34 AM  Result Value Ref Range Status   Specimen Description BLOOD LEFT HAND  Final   Special Requests IN PEDIATRIC BOTTLE Blood Culture adequate volume  Final   Culture   Final    NO GROWTH 3 DAYS Performed at [facility], [address] [place], [place] [address]    Report Status PENDING  Incomplete  MRSA PCR Screening     Status: None   Collection Time: 09/05/17  9:29 PM  Result Value Ref Range Status   MRSA by  PCR NEGATIVE  NEGATIVE Final    Comment:        The GeneXpert MRSA Assay (FDA approved for NASAL specimens only), is one component of a comprehensive MRSA colonization surveillance program. It is not intended to diagnose MRSA infection nor to guide or monitor treatment for MRSA infections.      Labs: Basic Metabolic Panel:  Recent Labs Lab 09/04/17 0223  09/04/17 1438 09/05/17 0615 09/06/17 0339 09/07/17 0318 09/08/17 0603  NA 130*  < > 129* 131* 131* 134* 130*  K 3.3*  < > 4.1 3.6 3.1* 3.1* 3.8  CL 94*  < > 98* 98* 100* 106 100*  CO2 26  < > 21* 24 22 21* 22  GLUCOSE 78  < > 92 136* 72 99 102*  BUN 6  < > <5* 5*  CREATININE 0.70  < > 0.56* 0.64 0.65 0.57* 0.57*  CALCIUM 7.8*  < > 7.9* 8.3* 7.8* 7.5* 8.1*  MG 1.5*  --   --   --  1.7 1.7 1.4*  < > = values in this interval not displayed. Liver Function Tests:  Recent Labs Lab 09/03/17 2108 09/06/17 0339 09/07/17 0318  AST 69* 41 32  ALT 46 27 22  ALKPHOS 160* 96 80  BILITOT 1.0 1.6* 1.1  PROT 7.4 6.2* 5.7*  ALBUMIN 3.6 2.8* 2.4*    Recent Labs Lab 09/03/17 2108 09/06/17 0339  LIPASE 18 15   No results for input(s): AMMONIA in the last 168 hours. CBC:  Recent Labs Lab 09/03/17 2108 09/04/17 0223 09/07/17 0318 09/08/17 0603  WBC 5.6 6.4 10.0 7.4  NEUTROABS  --   --  7.4 4.6  HGB 14.3 13.7 10.0* 11.0*  HCT 37.4* 37.3* 28.3* 30.9*  MCV 85.8 87.6 90.1 90.9  PLT 278 264 148* 197   Cardiac Enzymes: No results for input(s): CKTOTAL, CKMB, CKMBINDEX, TROPONINI in the last 168 hours. BNP: BNP (last 3 results) No results for input(s): BNP in the last 8760 hours.  ProBNP (last 3 results) No results for input(s): PROBNP in the last 8760 hours.  CBG:  Recent Labs Lab 09/04/17 0738 09/05/17 0658 09/06/17 0818 09/07/17 0746 09/08/17 0711  GLUCAP 99 135* 71 111* 114*       Signed:  Alexy Heldt MD, PhD  [facility] 09/08/2017, 3:33 PM

## 2017-09-08 NOTE — Care Management Note (Addendum)
Case Management Note  Patient Details  Name: Clayton Alvarez MRN: [id number] Date of Birth: 12-Oct-1961  Subjective/Objective:     Hyponatremia, ETOH, N/V               Action/Plan: Discharge Planning: NCM spoke to pt and he does have a PCP. Listed PCP on his dc instructions to call and make an appt. Pt wants a list of dentist. Explained he will need to call his insurance toll free number for a provider list. Has RW at home. Lives at home alone. Pt states he wants an aide to assist at home. NCM will fax dc summary to PCP with comments to assist with application for [employer] PCS.   PCP AVBUERE, EDWIN MD  09/09/2017 641 pm Pt active with [facility] for HH. [facility].    Expected Discharge Date:  09/08/17               Expected Discharge Plan:  Home/Self Care  In-House Referral:  NA  Discharge planning Services  CM Consult  Post Acute Care Choice:  NA Choice offered to:  NA  DME Arranged:  N/A DME Agency:  NA  HH Arranged:  NA HH Agency:  NA  Status of Service:  Completed, signed off  If discussed at Long Length of Stay Meetings, dates discussed:    Additional Comments:  Elliot Cousin, RN 09/08/2017, 12:27 PM

## 2017-09-08 NOTE — Evaluation (Signed)
Physical Therapy Evaluation Patient Details Name: Clayton Alvarez MRN: [id number] DOB: 09-Feb-1961 Today's Date: 09/08/2017   History of Present Illness  Pt admitted with N/V and abd pain and with hx of ETOH  Clinical Impression  Pt admitted as above and presenting with functional mobility limitations 2* generalized weakness and ambulatory balance deficits.  Pt demonstrating good stability with use of RW and agreeable to initially utilizing same at home.  Pt also admits increased instability with current footwear and agreeable to use proper shoes at home.  Pt also agreeable to follow up with HHPT.    Follow Up Recommendations Home health PT    Equipment Recommendations  None recommended by PT    Recommendations for Other Services       Precautions / Restrictions Precautions Precautions: Fall Restrictions Weight Bearing Restrictions: No      Mobility  Bed Mobility Overal bed mobility: Modified Independent             General bed mobility comments: Pt supine<>sit unassisted  Transfers Overall transfer level: Modified independent               General transfer comment: Pt unassisted sit<>stand with cues for safe transition position   Ambulation/Gait Ambulation/Gait assistance: Min guard;Supervision;Modified independent (Device/Increase time) Ambulation Distance (Feet): 400 Feet Assistive device: Rolling walker (2 wheeled);Straight cane;None Gait Pattern/deviations: Step-through pattern;Decreased step length - right;Decreased step length - left;Shuffle;Wide base of support Gait velocity: decr Gait velocity interpretation: Below normal speed for age/gender General Gait Details: Pt ambulated 400' total; Pt unsteady with home flip flops on but improved with move to footies.  Pt unsteady sans AD with only marginal improvement using straight cane but marked improvement with use of RW  Stairs            Wheelchair Mobility    Modified Rankin (Stroke Patients  Only)       Balance Overall balance assessment: Needs assistance Sitting-balance support: Feet supported;No upper extremity supported Sitting balance-Leahy Scale: Good     Standing balance support: No upper extremity supported Standing balance-Leahy Scale: Fair                               Pertinent Vitals/Pain Pain Assessment: No/denies pain    Home Living Family/patient expects to be discharged to:: Private residence Living Arrangements: Parent   Type of Home: House Home Access: Stairs to enter Entrance Stairs-Rails: Right Entrance Stairs-Number of Steps: 2 Home Layout: One level Home Equipment: Environmental consultant - 2 wheels;Cane - single point Additional Comments: Pt unclear on assist at home    Prior Function Level of Independence: Independent;Independent with assistive device(s)         Comments: used cane intermittently but also has RW     Hand Dominance        Extremity/Trunk Assessment   Upper Extremity Assessment Upper Extremity Assessment: Generalized weakness    Lower Extremity Assessment Lower Extremity Assessment: Generalized weakness       Communication   Communication: No difficulties  Cognition Arousal/Alertness: Awake/alert Behavior During Therapy: WFL for tasks assessed/performed Overall Cognitive Status: Within Functional Limits for tasks assessed                                        General Comments      Exercises     Assessment/Plan  PT Assessment Patient needs continued PT services  PT Problem List Decreased strength;Decreased activity tolerance;Decreased balance;Decreased mobility;Pain;Decreased knowledge of use of DME       PT Treatment Interventions DME instruction;Gait training;Stair training;Functional mobility training;Therapeutic activities;Therapeutic exercise;Patient/family education    PT Goals (Current goals can be found in the Care Plan section)  Acute Rehab PT Goals Patient Stated  Goal: HOME PT Goal Formulation: All assessment and education complete, DC therapy    Frequency Min 3X/week   Barriers to discharge Decreased caregiver support Pt unclear on level of assist available at home    Co-evaluation               AM-PAC PT "6 Clicks" Daily Activity  Outcome Measure Difficulty turning over in bed (including adjusting bedclothes, sheets and blankets)?: None Difficulty moving from lying on back to sitting on the side of the bed? : None Difficulty sitting down on and standing up from a chair with arms (e.g., wheelchair, bedside commode, etc,.)?: A Little Help needed moving to and from a bed to chair (including a wheelchair)?: None Help needed walking in hospital room?: A Little Help needed climbing 3-5 steps with a railing? : A Little 6 Click Score: 21    End of Session Equipment Utilized During Treatment: Gait belt Activity Tolerance: Patient tolerated treatment well Patient left: in bed;with call bell/phone within reach;with bed alarm set Nurse Communication: Mobility status PT Visit Diagnosis: Unsteadiness on feet (R26.81);Difficulty in walking, not elsewhere classified (R26.2)    Time: 1610-9604 PT Time Calculation (min) (ACUTE ONLY): 33 min   Charges:   PT Evaluation $PT Eval Low Complexity: 1 Low PT Treatments $Gait Training: 8-22 mins   PT G Codes:        Pg [phone]   Dariona Postma 09/08/2017, 5:10 PM

## 2017-09-08 NOTE — Progress Notes (Signed)
CSW called to get patient taxi voucher- CSW confirmed no family can take pt home and PT evaluated pt and does not believe they are safe to ride the bus.  CSW confirmed home address and provided RN with taxi voucher  CSW signing off.  Burna Sis, LCSW Clinical Social Worker [phone]

## 2017-09-08 NOTE — Care Management Important Message (Signed)
Important Message  Patient Details  Name: RAHKEEM SENFT MRN: [id number] Date of Birth: Jul 01, 1961   Medicare Important Message Given:  Yes    Elliot Cousin, RN 09/08/2017, 1:11 PM

## 2017-09-08 NOTE — Progress Notes (Signed)
Pt leaving this afternoon via [employer] cab, provided by hospital. Pt alert, oriented, and without c/o. Discharge instructions/prescriptions given/explained with pt verbalizing understanding.  Pt has FWW and cane at home. Advised about safety of using equipment at home. Followup noted. [facility] to contact pt for [facility] RN, PT, Aide.

## 2017-09-10 LAB — CULTURE, BLOOD (ROUTINE X 2)
Culture: NO GROWTH
Culture: NO GROWTH
SPECIAL REQUESTS: ADEQUATE
SPECIAL REQUESTS: ADEQUATE

## 2017-10-01 ENCOUNTER — Emergency Department (HOSPITAL_COMMUNITY)
Admission: EM | Admit: 2017-10-01 | Discharge: 2017-10-01 | Disposition: A | Discharge status: Home / Self Care | Payer: Medicare Other | Attending: Emergency Medicine | Admitting: Emergency Medicine

## 2017-10-01 ENCOUNTER — Emergency Department (HOSPITAL_COMMUNITY): Payer: Medicare Other

## 2017-10-01 DIAGNOSIS — Z79899 Other long term (current) drug therapy: Secondary | ICD-10-CM | POA: Diagnosis not present

## 2017-10-01 DIAGNOSIS — F17200 Nicotine dependence, unspecified, uncomplicated: Secondary | ICD-10-CM | POA: Diagnosis not present

## 2017-10-01 DIAGNOSIS — E876 Hypokalemia: Secondary | ICD-10-CM | POA: Diagnosis not present

## 2017-10-01 DIAGNOSIS — E871 Hypo-osmolality and hyponatremia: Secondary | ICD-10-CM | POA: Insufficient documentation

## 2017-10-01 DIAGNOSIS — R066 Hiccough: Secondary | ICD-10-CM | POA: Insufficient documentation

## 2017-10-01 DIAGNOSIS — F1010 Alcohol abuse, uncomplicated: Secondary | ICD-10-CM | POA: Insufficient documentation

## 2017-10-01 DIAGNOSIS — R079 Chest pain, unspecified: Secondary | ICD-10-CM | POA: Diagnosis present

## 2017-10-01 LAB — COMPREHENSIVE METABOLIC PANEL
ALT: 18 U/L (ref 17–63)
AST: 39 U/L (ref 15–41)
Albumin: 3.7 g/dL (ref 3.5–5.0)
Alkaline Phosphatase: 122 U/L (ref 38–126)
Anion gap: 16 — ABNORMAL HIGH (ref 5–15)
BUN: <5 mg/dL — ABNORMAL LOW (ref 6–20)
CHLORIDE: 86 mmol/L — AB (ref 101–111)
CO2: 26 mmol/L (ref 22–32)
CREATININE: 0.92 mg/dL (ref 0.61–1.24)
Calcium: 8.7 mg/dL — ABNORMAL LOW (ref 8.9–10.3)
GFR calc non Af Amer: >60 mL/min (ref >60)
GLUCOSE: 89 mg/dL (ref 65–99)
Potassium: 3 mmol/L — ABNORMAL LOW (ref 3.5–5.1)
SODIUM: 128 mmol/L — AB (ref 135–145)
Total Bilirubin: 1.4 mg/dL — ABNORMAL HIGH (ref 0.3–1.2)
Total Protein: 7.7 g/dL (ref 6.5–8.1)

## 2017-10-01 LAB — CBC WITH DIFFERENTIAL/PLATELET
Basophils Absolute: 0 10*3/uL (ref 0.0–0.1)
Basophils Relative: 0 %
EOS ABS: 0 10*3/uL (ref 0.0–0.7)
Eosinophils Relative: 0 %
HEMATOCRIT: 38.2 % — AB (ref 39.0–52.0)
HEMOGLOBIN: 13.8 g/dL (ref 13.0–17.0)
LYMPHS ABS: 1 10*3/uL (ref 0.7–4.0)
Lymphocytes Relative: 10 %
MCH: 31.5 pg (ref 26.0–34.0)
MCHC: 36.1 g/dL — AB (ref 30.0–36.0)
MCV: 87.2 fL (ref 78.0–100.0)
MONO ABS: 0.8 10*3/uL (ref 0.1–1.0)
MONOS PCT: 8 %
NEUTROS PCT: 82 %
Neutro Abs: 8.7 10*3/uL — ABNORMAL HIGH (ref 1.7–7.7)
Platelets: 167 10*3/uL (ref 150–400)
RBC: 4.38 MIL/uL (ref 4.22–5.81)
RDW: 13.6 % (ref 11.5–15.5)
WBC: 10.5 10*3/uL (ref 4.0–10.5)

## 2017-10-01 LAB — I-STAT TROPONIN, ED: Troponin i, poc: 0.01 ng/mL (ref 0.00–0.08)

## 2017-10-01 MED ORDER — LORAZEPAM 2 MG/ML IJ SOLN
0.5000 mg | Freq: Once | INTRAMUSCULAR | Status: AC
  Administered 2017-10-01: 0.5 mg via INTRAVENOUS
  Filled 2017-10-01: qty 1

## 2017-10-01 MED ORDER — SODIUM CHLORIDE 0.9 % IV BOLUS (SEPSIS)
1000.0000 mL | Freq: Once | INTRAVENOUS | Status: AC
  Administered 2017-10-01: 1000 mL via INTRAVENOUS

## 2017-10-01 MED ORDER — CHLORPROMAZINE HCL 25 MG PO TABS
25.0000 mg | ORAL_TABLET | Freq: Once | ORAL | Status: AC
  Administered 2017-10-01: 25 mg via ORAL
  Filled 2017-10-01: qty 1

## 2017-10-01 MED ORDER — MAGNESIUM SULFATE 2 GM/50ML IV SOLN
2.0000 g | Freq: Once | INTRAVENOUS | Status: AC
  Administered 2017-10-01: 2 g via INTRAVENOUS
  Filled 2017-10-01: qty 50

## 2017-10-01 MED ORDER — POTASSIUM CHLORIDE CRYS ER 20 MEQ PO TBCR
40.0000 meq | EXTENDED_RELEASE_TABLET | Freq: Once | ORAL | Status: AC
  Administered 2017-10-01: 40 meq via ORAL
  Filled 2017-10-01: qty 2

## 2017-10-01 MED ORDER — CHLORPROMAZINE HCL 25 MG PO TABS: 25.0000 mg | ORAL_TABLET | Freq: Three times a day (TID) | ORAL | 9 refills | Status: AC | PRN

## 2017-10-01 NOTE — ED Notes (Signed)
ED Provider at bedside. 

## 2017-10-01 NOTE — ED Provider Notes (Addendum)
[facility] EMERGENCY DEPARTMENT Provider Note   CSN: [id number] Arrival date & time: 10/01/17  1524     History   Chief Complaint No chief complaint on file.  Complaint hiccups HPI Clayton Alvarez is a 56 y.o. male.  HPIcomplains of hiccups onset 2-3 weeks ago. Constant. He complains of anterior chest pain worse with vomiting or worse with pickupshe states that the vomiting comes after hiccuping. He denies other associated symptoms he had similar symptomslast month. He was hospitalized with electrolyte abnormalities. He admits to drinking beer this morning. He drinks beer daily. Treated by EMS with aspirin  Zofranand sublingual nitroglycerin prior to coming here, without relief  Past Medical History:  Diagnosis Date  . Alcohol abuse     Patient Active Problem List   Diagnosis Date Noted  . Hyponatremia 09/04/2017  . Hypokalemia 09/04/2017  . Nausea & vomiting 09/04/2017  . Abdominal pain 09/04/2017  . Tobacco abuse 09/04/2017  . Alcohol abuse     Past Surgical History:  Procedure Laterality Date  . FACIAL LACERATION REPAIR Left        Home Medications    Prior to Admission medications   Medication Sig Start Date End Date Taking? Authorizing Provider  acetaminophen (TYLENOL) 325 MG tablet Take 650 mg by mouth every 6 (six) hours as needed. For hip pain    [provider]  amLODipine (NORVASC) 10 MG tablet Take 1 tablet (10 mg total) by mouth daily. 09/08/17 09/08/18  Albertine Grates, MD  chlorproMAZINE (THORAZINE) 10 MG tablet Take 1 tablet (10 mg total) by mouth 4 (four) times daily as needed for hiccoughs. 09/08/17   Albertine Grates, MD  folic acid (FOLVITE) 1 MG tablet Take 1 tablet (1 mg total) by mouth daily. 09/09/17   Albertine Grates, MD  pantoprazole (PROTONIX) 20 MG tablet Take 1 tablet (20 mg total) by mouth daily. 09/08/17   Albertine Grates, MD  polyvinyl alcohol (LIQUIFILM TEARS) 1.4 % ophthalmic solution Place 1 drop into both eyes as needed for dry eyes.     [provider]  thiamine 100 MG tablet Take 1 tablet (100 mg total) by mouth daily. 09/09/17   Albertine Grates, MD    Family History No family history on file.  Social History Social History  Substance Use Topics  . Smoking status: Current Every Day Smoker  . Smokeless tobacco: Never Used  . Alcohol use 43.2 oz/week    72 Cans of beer per week    Admits to crack cocaine use last time 3 weeks ago. No history of IV drug use Allergies   Patient has no known allergies.   Review of Systems Review of Systems  Constitutional: Negative.   HENT: Negative.   Respiratory: Negative.        Hiccups  Cardiovascular: Positive for chest pain.       Chest pain with hiccups or with vomiting  Gastrointestinal: Positive for vomiting.  Musculoskeletal: Negative.   Skin: Negative.   Neurological: Negative.   Psychiatric/Behavioral: Negative.   All other systems reviewed and are negative.    Physical Exam Updated Vital Signs BP (!) 139/100 (BP Location: Right Arm)   Pulse 93   Temp 99 F (37.2 C) (Oral)   Resp (!) 24   SpO2 99%   Physical Exam  Constitutional:  Chronically ill-appearing, cachectic  HENT:  Head: Normocephalic and atraumatic.  Eyes: Pupils are equal, round, and reactive to light. Conjunctivae are normal.  Neck: Neck supple. No tracheal deviation  present. No thyromegaly present.  Cardiovascular: Normal rate and regular rhythm.   No murmur heard. Pulmonary/Chest: Effort normal and breath sounds normal.  hiccuping frequently  Abdominal: Soft. Bowel sounds are normal. He exhibits no distension. There is no tenderness.  Musculoskeletal: Normal range of motion. He exhibits no edema or tenderness.  Neurological: He is alert. Coordination normal.  Skin: Skin is warm and dry. No rash noted.  Psychiatric: He has a normal mood and affect.  Nursing note and vitals reviewed.    ED Treatments / Results  Labs (all labs ordered are listed, but only abnormal results  are displayed) Labs Reviewed  COMPREHENSIVE METABOLIC PANEL  CBC WITH DIFFERENTIAL/PLATELET  I-STAT TROPONIN, ED    EKG  EKG Interpretation  Date/Time:  Monday October 01 2017 15:42:12 EDT Ventricular Rate:  93 PR Interval:    QRS Duration: 83 QT Interval:  375 QTC Calculation: 467 R Axis:   55 Text Interpretation:  Sinus rhythm Ventricular premature complex Probable left atrial enlargement Since last tracing rate slower Confirmed by Doug Sou [phone]) on 10/01/2017 3:51:30 PM      Results for orders placed or performed during the hospital encounter of 10/01/17  Comprehensive metabolic panel  Result Value Ref Range   Sodium 128 (L) 135 - 145 mmol/L   Potassium 3.0 (L) 3.5 - 5.1 mmol/L   Chloride 86 (L) 101 - 111 mmol/L   CO2 26 22 - 32 mmol/L   Glucose, Bld 89 65 - 99 mg/dL   BUN <5 (L) 6 - 20 mg/dL   Creatinine, Ser 6.04 0.61 - 1.24 mg/dL   Calcium 8.7 (L) 8.9 - 10.3 mg/dL   Total Protein 7.7 6.5 - 8.1 g/dL   Albumin 3.7 3.5 - 5.0 g/dL   AST 39 15 - 41 U/L   ALT 18 17 - 63 U/L   Alkaline Phosphatase 122 38 - 126 U/L   Total Bilirubin 1.4 (H) 0.3 - 1.2 mg/dL   GFR calc non Af Amer >60 >60 mL/min   GFR calc Af Amer >60 >60 mL/min   Anion gap 16 (H) 5 - 15  CBC with Differential/Platelet  Result Value Ref Range   WBC 10.5 4.0 - 10.5 K/uL   RBC 4.38 4.22 - 5.81 MIL/uL   Hemoglobin 13.8 13.0 - 17.0 g/dL   HCT 54.0 (L) 98.1 - 19.1 %   MCV 87.2 78.0 - 100.0 fL   MCH 31.5 26.0 - 34.0 pg   MCHC 36.1 (H) 30.0 - 36.0 g/dL   RDW 47.8 29.5 - 62.1 %   Platelets 167 150 - 400 K/uL   Neutrophils Relative % 82 %   Neutro Abs 8.7 (H) 1.7 - 7.7 K/uL   Lymphocytes Relative 10 %   Lymphs Abs 1.0 0.7 - 4.0 K/uL   Monocytes Relative 8 %   Monocytes Absolute 0.8 0.1 - 1.0 K/uL   Eosinophils Relative 0 %   Eosinophils Absolute 0.0 0.0 - 0.7 K/uL   Basophils Relative 0 %   Basophils Absolute 0.0 0.0 - 0.1 K/uL  I-stat troponin, ED  Result Value Ref Range   Troponin i, poc  0.01 0.00 - 0.08 ng/mL   Comment 3           Dg Abd 1 View  Result Date: 09/05/2017 CLINICAL DATA:  56 year old male with nausea vomiting and confusion. EXAM: ABDOMEN - 1 VIEW COMPARISON:  07/05/2010 and earlier. FINDINGS: Portable AP view at 0800 hours. Mild motion artifact. Non obstructed bowel gas pattern.  Abdominal visceral contours are within normal limits. 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 Bulky right lateral lumbar spine vertebral endplate spurring. No acute osseous abnormality identified. IMPRESSION: 1. Normal bowel gas pattern. 2. 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 Electronically Signed   By: Odessa Fleming M.D.   On: 09/05/2017 09:25   Dg Chest Port 1 View  Result Date: 10/01/2017 CLINICAL DATA:  Chest pain EXAM: PORTABLE CHEST 1 VIEW COMPARISON:  09/05/2017 FINDINGS: 1609 hours. Lungs are hyperexpanded. Patchy nodular opacities identified at the left base, potentially with some bronchiectasis. Similar but less prominent changes noted medial right lung base. The cardiopericardial silhouette is within normal limits for size. The visualized bony structures of the thorax are intact. Telemetry leads overlie the chest. IMPRESSION: Subtle micro nodularity in the medial lung bases bilaterally. Atypical infection would be a consideration. Electronically Signed   By: Kennith Center M.D.   On: 10/01/2017 16:38   Dg Chest Port 1 View  Result Date: 09/05/2017 CLINICAL DATA:  Confusion.  History of alcohol abuse. EXAM: PORTABLE CHEST 1 VIEW COMPARISON:  03/30/2017. FINDINGS: Normal cardiomediastinal silhouette. Early opacity medial RIGHT base could represent developing infiltrate. No effusion or pneumothorax. No acute osseous findings. IMPRESSION: Cannot exclude early RIGHT lower lobe infiltrate. Continued surveillance warranted. Electronically Signed   By: Elsie Stain M.D.   On: 09/05/2017 09:19   Radiology No results  found.  Procedures Procedures (including critical care time)  Medications Ordered in ED Medications  chlorproMAZINE (THORAZINE) tablet 25 mg (not administered)     Initial Impression / Assessment and Plan / ED Course  I have reviewed the triage vital signs and the nursing notes.  Pertinent labs & imaging results that were available during my care of the patient were reviewed by me and considered in my medical decision making (see chart for details).    9:10 PM feels improved after treatment withThorazine. Hiccups improved, though not gone. He also received oral potassium supplementation and IV magnesium supplementation as he's been hypomagnesemic in the past. Plan referral primary care. Prescriptions Thorazine. Resource guide for alcohol abuse. Chest pain felt secondary to hiccups 9:45 PM patient alert ambulatory. Pulse counted at 92 bpm by me Final Clinical Impressions(s) / ED Diagnoses  Diagnosis #1 hiccups #2 hypokalemia #3 chronicalcohol abuse Final diagnoses:  None   #4 mild hyponatremia New Prescriptions New Prescriptions   No medications on file     Doug Sou, MD 10/01/17 2145    Doug Sou, MD 10/01/17 2151

## 2017-10-01 NOTE — Discharge Instructions (Signed)
Take the medication as prescribed for hiccups. Call the number on these instructions to get a primary care physician.You can also call any of the numbers listed on the resource guide to get help with your alcohol problem

## 2017-10-01 NOTE — ED Triage Notes (Signed)
Pt arrived via [facility] who found pt laying on ground amongst several empty ETOH bottles. Pt was found to have right sided weakness and slurred speech. Pt was unable to effectively communicate with EMS so it is unknown if the symptoms are new onset or not. Pt is alert but unable to tell us his name or other information. Pt arrived with several bags of personal items and was wearing rusty key rings on his fingers of which were cut off upon arrival.

## 2017-12-18 DEATH — deceased

## 2018-07-10 IMAGING — RF DG SWALLOWING FUNCTION - NRPT [facility]
8 series · 23 of 24 positions shown · non-contrast
Comparison: none

[Series 1: cp_standard · 0.51mm/px · 3 of 121 frames shown (1 of 8)]
[frame 19/121]
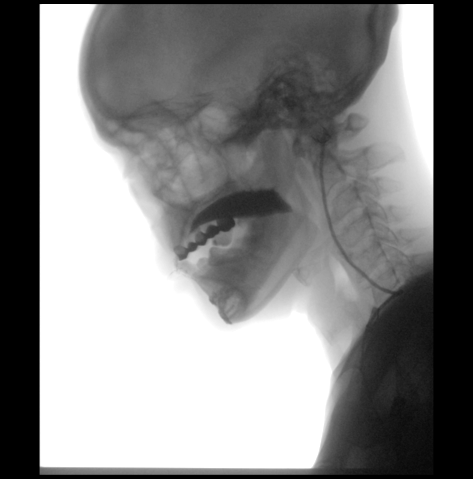
[frame 61/121]
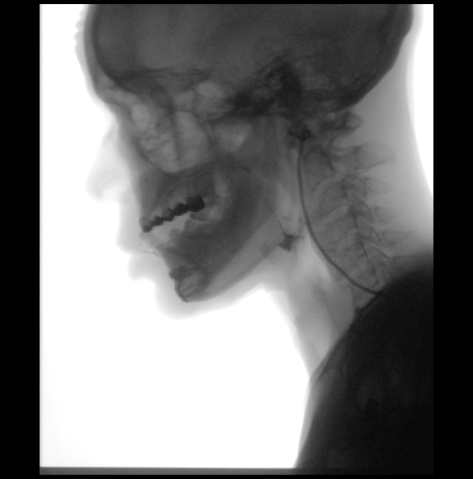
[frame 119/121]
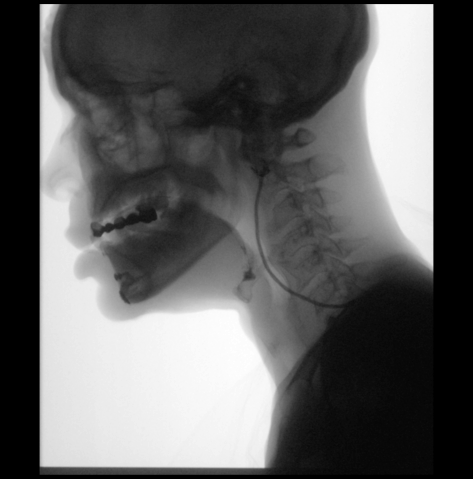

[Series 2: cp_standard · 0.51mm/px · 3 of 289 frames shown (2 of 8)]
[frame 44/289]
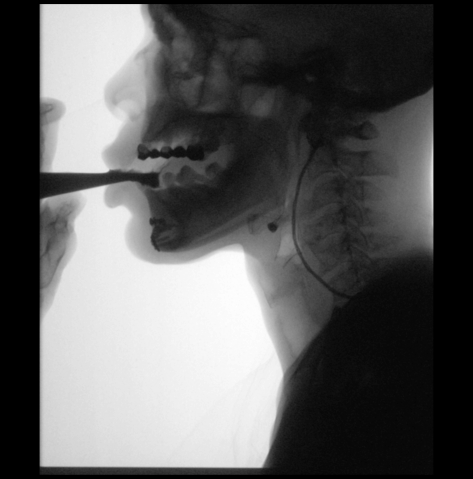
[frame 64/289]
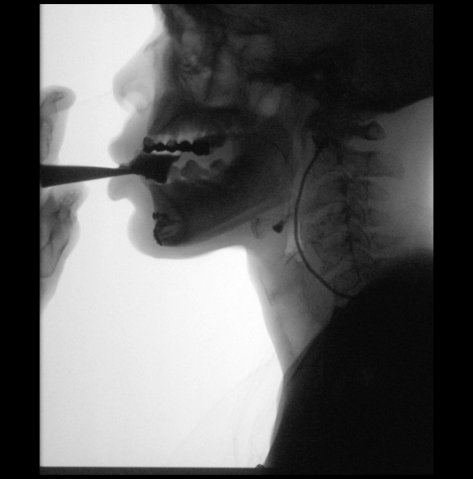
[frame 246/289]
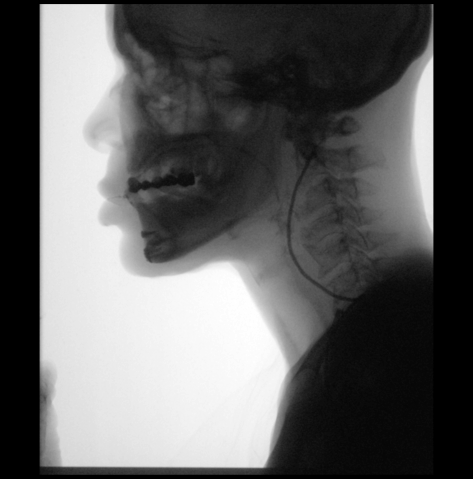

[Series 3: cp_standard · 0.51mm/px · 3 of 303 frames shown (3 of 8)]
[frame 46/303]
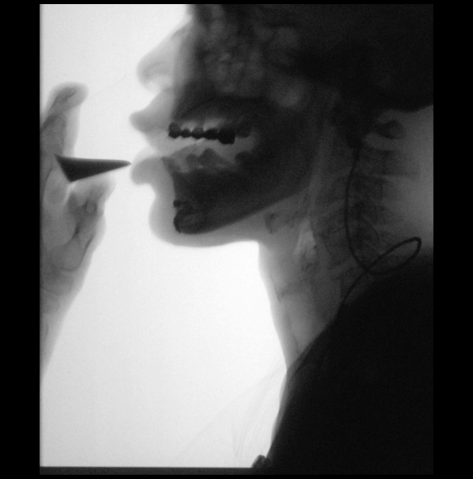
[frame 121/303]
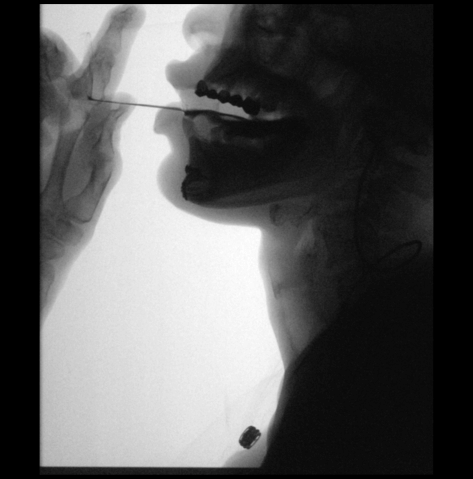
[frame 258/303]
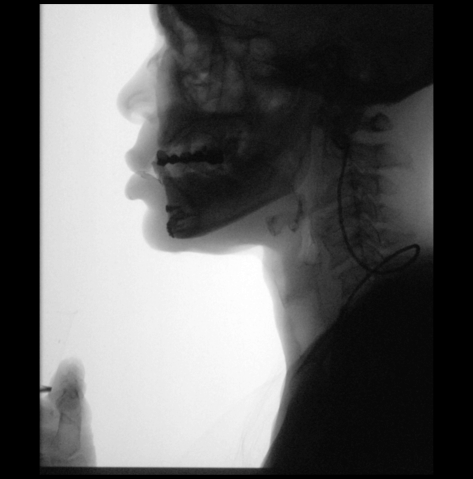

[Series 4: cp_standard · 0.51mm/px · 2 of 403 frames shown (4 of 8)]
[frame 61/403]
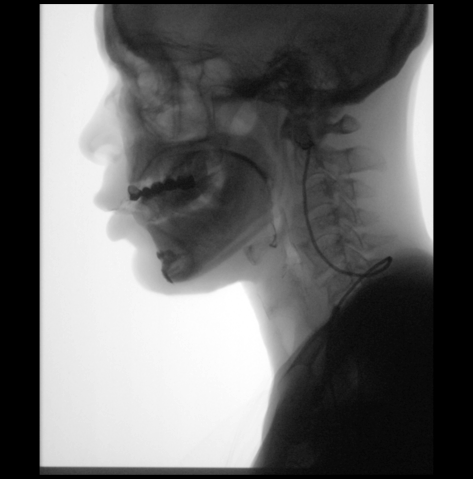
[frame 202/403]
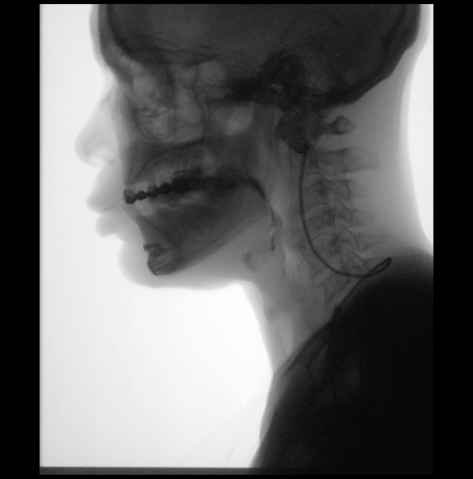

[Series 5: cp_standard · 0.51mm/px · 3 of 327 frames shown (5 of 8)]
[frame 50/327]
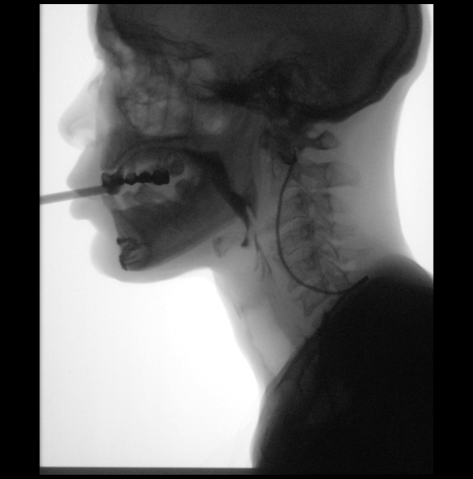
[frame 187/327]
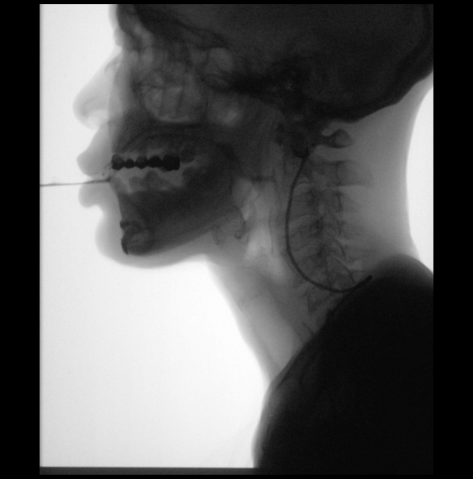
[frame 278/327]
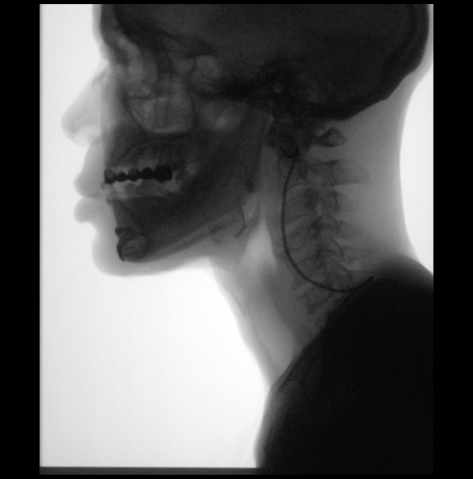

[Series 6: cp_standard · 0.51mm/px · 3 of 724 frames shown (6 of 8)]
[frame 109/724]
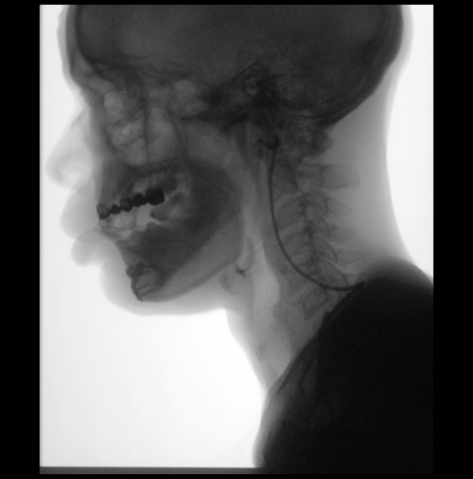
[frame 431/724]
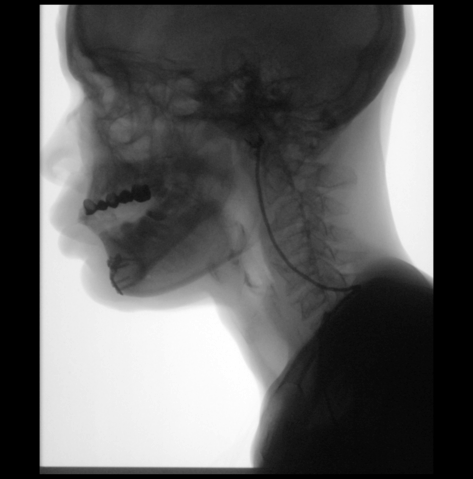
[frame 616/724]
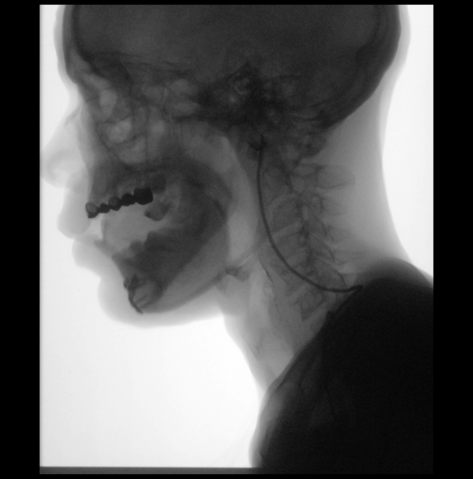

[Series 7: cp_standard · 0.51mm/px · 3 of 48 frames shown (7 of 8)]
[frame 8/48]
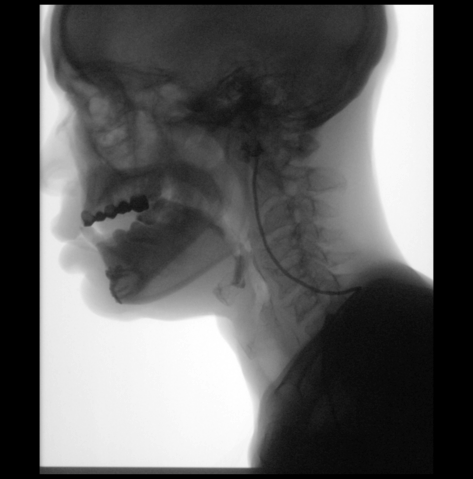
[frame 41/48]
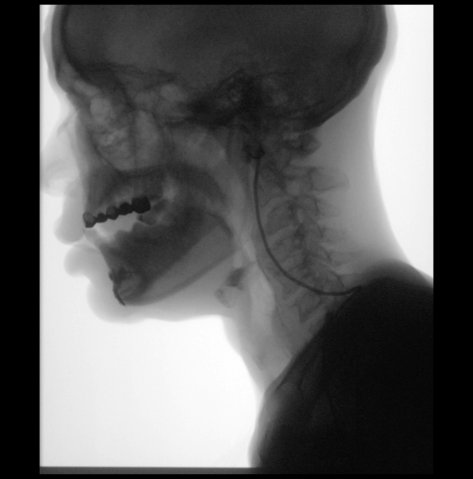
[frame 46/48]
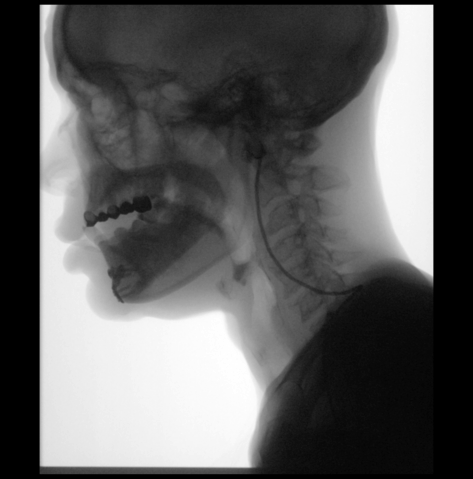

[Series 8: cp_standard · 0.51mm/px · 3 of 308 frames shown (8 of 8)]
[frame 47/308]
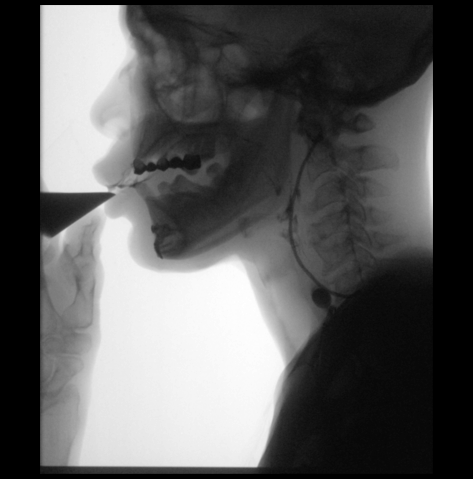
[frame 155/308]
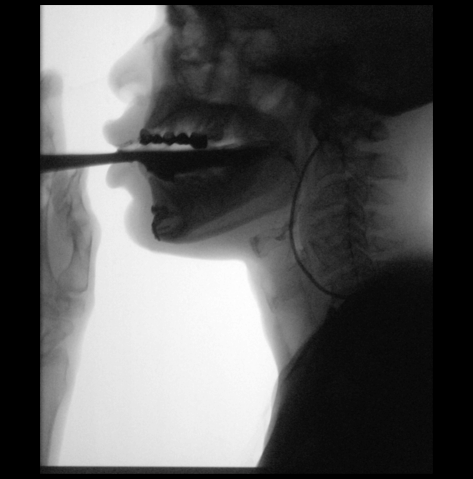
[frame 262/308]
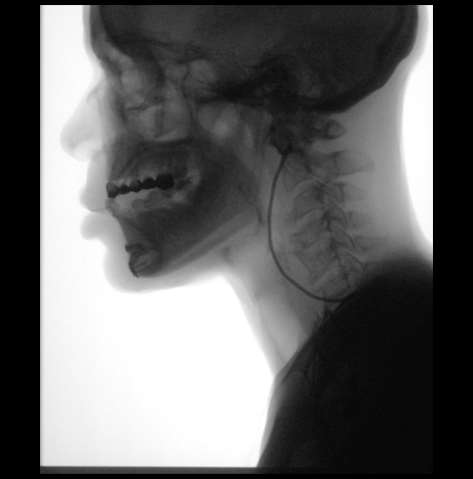

[23 of 24 positions shown; findings below may reference images not displayed]

FLUOROSCOPY FOR SWALLOWING FUNCTION STUDY:
Fluoroscopy was provided for swallowing function study, which was administered by a speech pathologist.  Final results and recommendations from this study are contained within the speech pathology report.
